# Patient Record
Sex: Female | Born: 1995 | Race: White | Hispanic: No | Marital: Single
Health system: Southern US, Academic
[De-identification: ages and names within clinical notes are randomized; demographics above are authoritative.]

## PROBLEM LIST (undated history)

## (undated) ENCOUNTER — Encounter
Attending: Student in an Organized Health Care Education/Training Program | Primary: Student in an Organized Health Care Education/Training Program

## (undated) ENCOUNTER — Telehealth

## (undated) ENCOUNTER — Encounter

## (undated) ENCOUNTER — Ambulatory Visit

## (undated) ENCOUNTER — Ambulatory Visit: Payer: PRIVATE HEALTH INSURANCE

## (undated) ENCOUNTER — Encounter: Attending: Internal Medicine | Primary: Internal Medicine

## (undated) ENCOUNTER — Ambulatory Visit
Attending: Student in an Organized Health Care Education/Training Program | Primary: Student in an Organized Health Care Education/Training Program

## (undated) ENCOUNTER — Encounter: Attending: Dermatology | Primary: Dermatology

## (undated) ENCOUNTER — Ambulatory Visit
Payer: Medicaid (Managed Care) | Attending: Student in an Organized Health Care Education/Training Program | Primary: Student in an Organized Health Care Education/Training Program

## (undated) ENCOUNTER — Ambulatory Visit
Payer: PRIVATE HEALTH INSURANCE | Attending: Student in an Organized Health Care Education/Training Program | Primary: Student in an Organized Health Care Education/Training Program

## (undated) ENCOUNTER — Encounter
Payer: Medicaid (Managed Care) | Attending: Student in an Organized Health Care Education/Training Program | Primary: Student in an Organized Health Care Education/Training Program

## (undated) ENCOUNTER — Telehealth: Attending: Dermatology | Primary: Dermatology

## (undated) ENCOUNTER — Encounter: Payer: PRIVATE HEALTH INSURANCE | Attending: Dermatology | Primary: Dermatology

## (undated) ENCOUNTER — Encounter: Attending: Obstetrics & Gynecology | Primary: Obstetrics & Gynecology

## (undated) ENCOUNTER — Ambulatory Visit: Payer: PRIVATE HEALTH INSURANCE | Attending: Obstetrics & Gynecology | Primary: Obstetrics & Gynecology

## (undated) ENCOUNTER — Ambulatory Visit: Payer: PRIVATE HEALTH INSURANCE | Attending: Dermatology | Primary: Dermatology

## (undated) DIAGNOSIS — F32A Depression, unspecified: Secondary | ICD-10-CM

## (undated) DIAGNOSIS — E282 Polycystic ovarian syndrome: Secondary | ICD-10-CM

## (undated) DIAGNOSIS — I10 Essential (primary) hypertension: Secondary | ICD-10-CM

## (undated) DIAGNOSIS — G4700 Insomnia, unspecified: Secondary | ICD-10-CM

## (undated) DIAGNOSIS — F329 Major depressive disorder, single episode, unspecified: Secondary | ICD-10-CM

## (undated) DIAGNOSIS — R933 Abnormal findings on diagnostic imaging of other parts of digestive tract: Secondary | ICD-10-CM

## (undated) DIAGNOSIS — K760 Fatty (change of) liver, not elsewhere classified: Secondary | ICD-10-CM

## (undated) HISTORY — DX: Abnormal findings on diagnostic imaging of other parts of digestive tract: R93.3

## (undated) HISTORY — PX: TONSILLECTOMY: SUR1361

## (undated) MED ORDER — MEDROXYPROGESTERONE 10 MG TABLET
Freq: Every day | ORAL | 0 days
Start: ? — End: 2020-04-05

---
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
## 2017-08-15 ENCOUNTER — Emergency Department (HOSPITAL_COMMUNITY)
Admission: EM | Admit: 2017-08-15 | Discharge: 2017-08-15 | Disposition: A | Discharge status: Home / Self Care | Payer: PRIVATE HEALTH INSURANCE | Attending: Emergency Medicine | Admitting: Emergency Medicine

## 2017-08-15 ENCOUNTER — Other Ambulatory Visit: Payer: Self-pay

## 2017-08-15 ENCOUNTER — Emergency Department (HOSPITAL_COMMUNITY): Payer: PRIVATE HEALTH INSURANCE

## 2017-08-15 ENCOUNTER — Encounter (HOSPITAL_COMMUNITY): Payer: Self-pay

## 2017-08-15 DIAGNOSIS — Y999 Unspecified external cause status: Secondary | ICD-10-CM | POA: Insufficient documentation

## 2017-08-15 DIAGNOSIS — Y929 Unspecified place or not applicable: Secondary | ICD-10-CM | POA: Insufficient documentation

## 2017-08-15 DIAGNOSIS — S0990XA Unspecified injury of head, initial encounter: Secondary | ICD-10-CM | POA: Diagnosis present

## 2017-08-15 DIAGNOSIS — W2209XA Striking against other stationary object, initial encounter: Secondary | ICD-10-CM | POA: Diagnosis not present

## 2017-08-15 DIAGNOSIS — Z79899 Other long term (current) drug therapy: Secondary | ICD-10-CM | POA: Insufficient documentation

## 2017-08-15 DIAGNOSIS — S060X1A Concussion with loss of consciousness of 30 minutes or less, initial encounter: Secondary | ICD-10-CM | POA: Diagnosis not present

## 2017-08-15 DIAGNOSIS — Y939 Activity, unspecified: Secondary | ICD-10-CM | POA: Insufficient documentation

## 2017-08-15 DIAGNOSIS — I10 Essential (primary) hypertension: Secondary | ICD-10-CM | POA: Insufficient documentation

## 2017-08-15 HISTORY — DX: Essential (primary) hypertension: I10

## 2017-08-15 LAB — I-STAT CHEM 8, ED
BUN: 10 mg/dL (ref 6–20)
CHLORIDE: 102 mmol/L (ref 98–111)
Calcium, Ion: 1.18 mmol/L (ref 1.15–1.40)
Creatinine, Ser: 0.5 mg/dL (ref 0.44–1.00)
Glucose, Bld: 89 mg/dL (ref 70–99)
HEMATOCRIT: 38 % (ref 36.0–46.0)
Hemoglobin: 12.9 g/dL (ref 12.0–15.0)
POTASSIUM: 3.9 mmol/L (ref 3.5–5.1)
SODIUM: 139 mmol/L (ref 135–145)
TCO2: 23 mmol/L (ref 22–32)

## 2017-08-15 LAB — I-STAT BETA HCG BLOOD, ED (MC, WL, AP ONLY)

## 2017-08-15 MED ORDER — METOCLOPRAMIDE HCL 10 MG PO TABS
10.0000 mg | ORAL_TABLET | Freq: Once | ORAL | Status: AC
  Administered 2017-08-15: 10 mg via ORAL
  Filled 2017-08-15: qty 1

## 2017-08-15 MED ORDER — ACETAMINOPHEN 325 MG PO TABS: 650.0000 mg | ORAL_TABLET | ORAL | Status: DC | PRN

## 2017-08-15 MED ORDER — METOCLOPRAMIDE HCL 10 MG PO TABS: 10.0000 mg | ORAL_TABLET | Freq: Four times a day (QID) | ORAL | 0 refills | Status: DC | PRN

## 2017-08-15 MED ORDER — IBUPROFEN 600 MG PO TABS: 600.0000 mg | ORAL_TABLET | Freq: Four times a day (QID) | ORAL | 0 refills | Status: DC | PRN

## 2017-08-15 MED ORDER — IBUPROFEN 200 MG PO TABS
600.0000 mg | ORAL_TABLET | Freq: Once | ORAL | Status: AC
  Administered 2017-08-15: 600 mg via ORAL
  Filled 2017-08-15: qty 3

## 2017-08-15 NOTE — ED Provider Notes (Signed)
[facility]-EMERGENCY DEPT Provider Note   CSN: [id number] Arrival date & time: 08/15/17  1231     History   Chief Complaint Chief Complaint  Patient presents with  . Facial Injury  . Loss of Consciousness    HPI Abigail Hale is a 22 y.o. female.  HPI Patient states she was at work when she stood up and struck her forehead and nose on a metal bar.  She had loss of consciousness for less than a minute.  No previous history of concussion.  States she is having headache, dizziness, photophobia and nausea currently.  Denies neck pain, focal weakness or numbness. Past Medical History:  Diagnosis Date  . Hypertension     There are no active problems to display for this patient.   Past Surgical History:  Procedure Laterality Date  . TONSILLECTOMY       OB History   None      Home Medications    Prior to Admission medications   Medication Sig Start Date End Date Taking? Authorizing Provider  metFORMIN (GLUCOPHAGE) 500 MG tablet Take 500 mg by mouth 2 (two) times daily with a meal.   Yes [provider]  QUEtiapine (SEROQUEL) 50 MG tablet Take 50 mg by mouth at bedtime.   Yes [provider]  spironolactone (ALDACTONE) 100 MG tablet Take 100 mg by mouth daily. 08/09/17  Yes [provider]  venlafaxine XR (EFFEXOR-XR) 150 MG 24 hr capsule Take 150 mg by mouth daily. 08/09/17  Yes [provider]  ibuprofen (ADVIL,MOTRIN) 600 MG tablet Take 1 tablet (600 mg total) by mouth every 6 (six) hours as needed. 08/15/17   Loren Racer, MD  metoCLOPramide (REGLAN) 10 MG tablet Take 1 tablet (10 mg total) by mouth every 6 (six) hours as needed for nausea. 08/15/17   Loren Racer, MD    Family History No family history on file.  Social History Social History   Tobacco Use  . Smoking status: Never Smoker  . Smokeless tobacco: Never Used  Substance Use Topics  . Alcohol use: Yes    Comment: socially  . Drug use:  Never     Allergies   Amoxicillin and Penicillins   Review of Systems Review of Systems  Constitutional: Negative for chills and fever.  HENT: Negative for facial swelling.   Eyes: Positive for photophobia.  Respiratory: Negative for shortness of breath.   Cardiovascular: Negative for chest pain.  Gastrointestinal: Positive for nausea. Negative for abdominal pain and vomiting.  Musculoskeletal: Negative for back pain and neck pain.  Skin: Positive for wound. Negative for rash.  Neurological: Positive for dizziness, syncope, light-headedness and headaches. Negative for weakness and numbness.  All other systems reviewed and are negative.    Physical Exam Updated Vital Signs BP 123/78 (BP Location: Right Arm)   Pulse 99   Temp 97.8 F (36.6 C) (Oral)   Resp 18   Ht 5\' 6"  (1.676 m)   Wt 117.9 kg (260 lb)   LMP 08/05/2017   SpO2 99%   BMI 41.97 kg/m   Physical Exam  Constitutional: She is oriented to person, place, and time. She appears well-developed and well-nourished. No distress.  HENT:  Head: Normocephalic and atraumatic.  Mouth/Throat: Oropharynx is clear and moist.  Patient with small abrasion to the bridge of the nose.  There is no active bleeding.  She does have some tenderness with palpation of the bridge.  Midface is stable.  No malocclusion.  No  intraoral trauma.  Eyes: Pupils are equal, round, and reactive to light. EOM are normal.  Neck: Normal range of motion. Neck supple.  No posterior midline cervical tenderness to palpation.  Cardiovascular: Normal rate and regular rhythm.  Pulmonary/Chest: Effort normal and breath sounds normal.  Abdominal: Soft. Bowel sounds are normal. There is no tenderness. There is no rebound and no guarding.  Musculoskeletal: Normal range of motion. She exhibits no edema or tenderness.  Neurological: She is alert and oriented to person, place, and time.  Patient is alert and oriented x3 with clear, goal oriented speech. Patient  has 5/5 motor in all extremities. Sensation is intact to light touch. Bilateral finger-to-nose is normal with no signs of dysmetria.  Skin: Skin is warm and dry. Capillary refill takes less than 2 seconds. No rash noted. She is not diaphoretic. No erythema.  Psychiatric: She has a normal mood and affect. Her behavior is normal.  Nursing note and vitals reviewed.    ED Treatments / Results  Labs (all labs ordered are listed, but only abnormal results are displayed) Labs Reviewed  I-STAT CHEM 8, ED  I-STAT BETA HCG BLOOD, ED ([facility], [facility], [facility] ONLY)    EKG None  Radiology Ct Head Wo Contrast  Result Date: 08/15/2017 CLINICAL DATA:  Dizziness and possible loss of consciousness post facial trauma. EXAM: CT HEAD WITHOUT CONTRAST TECHNIQUE: Contiguous axial images were obtained from the base of the skull through the vertex without intravenous contrast. COMPARISON:  None. FINDINGS: Brain: No evidence of acute infarction, hemorrhage, hydrocephalus, extra-axial collection or mass lesion/mass effect. Vascular: No hyperdense vessel or unexpected calcification. Skull: Normal. Negative for fracture or focal lesion. Sinuses/Orbits: No acute finding. Other: None. IMPRESSION: No acute intracranial abnormality. Electronically Signed   By: Ted Mcalpineobrinka  Dimitrova M.D.   On: 08/15/2017 15:10    Procedures Procedures (including critical care time)  Medications Ordered in ED Medications  ibuprofen (ADVIL,MOTRIN) tablet 600 mg (has no administration in time range)  acetaminophen (TYLENOL) tablet 650 mg (has no administration in time range)  metoCLOPramide (REGLAN) tablet 10 mg (has no administration in time range)     Initial Impression / Assessment and Plan / ED Course  I have reviewed the triage vital signs and the nursing notes.  Pertinent labs & imaging results that were available during my care of the patient were reviewed by me and considered in my medical decision making (see chart for details).      Normal neurologic exam.  Vital signs are stable.  CT head without acute findings.  Given concussion precautions and work note.  Gradually return to normal activities.  If symptoms persist follow-up with neurology as outpatient.  Return precautions given.   Final Clinical Impressions(s) / ED Diagnoses   Final diagnoses:  Concussion with loss of consciousness of 30 minutes or less, initial encounter    ED Discharge Orders        Ordered    ibuprofen (ADVIL,MOTRIN) 600 MG tablet  Every 6 hours PRN     08/15/17 1553    metoCLOPramide (REGLAN) 10 MG tablet  Every 6 hours PRN     08/15/17 1553       Loren RacerYelverton, Anouk Critzer, MD 08/15/17 1606

## 2017-08-15 NOTE — ED Triage Notes (Addendum)
Pt was standing up at work and hit the bridge of her nose on a metal handle. Pt states that she hit very hard and her glasses went flying. Pt states that she is dizzy from the incident. Pt states that she may have "blacked out"

## 2017-08-15 NOTE — ED Notes (Signed)
Ice pack given

## 2017-08-25 ENCOUNTER — Ambulatory Visit
Admission: EM | Admit: 2017-08-25 | Discharge: 2017-08-25 | Disposition: A | Discharge status: Home / Self Care | Payer: Self-pay | Attending: Family Medicine | Admitting: Family Medicine

## 2017-08-25 ENCOUNTER — Encounter: Payer: Self-pay | Admitting: Emergency Medicine

## 2017-08-25 ENCOUNTER — Other Ambulatory Visit: Payer: Self-pay

## 2017-08-25 DIAGNOSIS — R21 Rash and other nonspecific skin eruption: Secondary | ICD-10-CM

## 2017-08-25 DIAGNOSIS — W57XXXA Bitten or stung by nonvenomous insect and other nonvenomous arthropods, initial encounter: Secondary | ICD-10-CM

## 2017-08-25 MED ORDER — METHYLPREDNISOLONE 4 MG PO TBPK: ORAL_TABLET | ORAL | 0 refills | Status: DC

## 2017-08-25 NOTE — Discharge Instructions (Addendum)
Please continue with Benadryl and calamine lotion.  Take prednisone as prescribed.  Avoid hot showers.  If any worsening rash, fevers return to the clinic.

## 2017-08-25 NOTE — ED Triage Notes (Signed)
Patient c/o red itchy bumps all over her body that started 2 weeks ago.

## 2017-08-25 NOTE — ED Provider Notes (Signed)
[facility]    CSN: [id number] Arrival date & time: 08/25/17  0803     History   Chief Complaint Chief Complaint  Patient presents with  . Rash    APPOINTMENT    HPI Abigail Hale is a 22 y.o. female.   Presents to the urgent care facility for evaluation of rash along her forearms, lower legs.  Rashes been present for nearly 2 weeks.  She denies any close contacts with similar rashes.  No new sleeping arrangements.  She is been using Benadryl and calamine lotion with mild relief.  She denies any fevers, headaches, body aches, neck pain, abdominal pain, nausea or vomiting.  She describes the rash is extremely pruritic.  She recently stayed with her boyfriend who has a dog, there is concern for fleas.  No interdigital rash no rash on her abdomen perineal region chest or back. HPI  Past Medical History:  Diagnosis Date  . Hypertension     There are no active problems to display for this patient.   Past Surgical History:  Procedure Laterality Date  . TONSILLECTOMY      OB History   None      Home Medications    Prior to Admission medications   Medication Sig Start Date End Date Taking? Authorizing Provider  amLODipine (NORVASC) 2.5 MG tablet Take 2.5 mg by mouth daily.   Yes [provider]  metFORMIN (GLUCOPHAGE) 500 MG tablet Take 500 mg by mouth 2 (two) times daily with a meal.   Yes [provider]  metoCLOPramide (REGLAN) 10 MG tablet Take 1 tablet (10 mg total) by mouth every 6 (six) hours as needed for nausea. 08/15/17  Yes Loren Racer, MD  QUEtiapine (SEROQUEL) 50 MG tablet Take 50 mg by mouth at bedtime.   Yes [provider]  spironolactone (ALDACTONE) 100 MG tablet Take 100 mg by mouth daily. 08/09/17  Yes [provider]  venlafaxine XR (EFFEXOR-XR) 150 MG 24 hr capsule Take 150 mg by mouth daily. 08/09/17  Yes [provider]  ibuprofen (ADVIL,MOTRIN) 600 MG tablet Take 1 tablet (600 mg total) by  mouth every 6 (six) hours as needed. 08/15/17   Loren Racer, MD  methylPREDNISolone (MEDROL DOSEPAK) 4 MG TBPK tablet Take Medrol Dosepak as directed by package 08/25/17   Evon Slack, PA-C    Family History History reviewed. No pertinent family history.  Social History Social History   Tobacco Use  . Smoking status: Never Smoker  . Smokeless tobacco: Never Used  Substance Use Topics  . Alcohol use: Yes    Comment: socially  . Drug use: Never     Allergies   Amoxicillin and Penicillins   Review of Systems Review of Systems  Constitutional: Negative for fever.  Respiratory: Negative for shortness of breath.   Cardiovascular: Negative for chest pain.  Gastrointestinal: Negative for nausea and vomiting.  Musculoskeletal: Negative for arthralgias, back pain and myalgias.  Skin: Positive for rash. Negative for wound.  Neurological: Negative for headaches.     Physical Exam Triage Vital Signs ED Triage Vitals  Enc Vitals Group     BP 08/25/17 0819 (!) 146/80     Pulse Rate 08/25/17 0819 100     Resp 08/25/17 0819 16     Temp 08/25/17 0819 98.2 F (36.8 C)     Temp Source 08/25/17 0819 Oral     SpO2 08/25/17 0819 100 %     Weight 08/25/17 0816 265 lb (120.2  kg)     Height 08/25/17 0816 5\' 6"  (1.676 m)     Head Circumference --      Peak Flow --      Pain Score 08/25/17 0815 0     Pain Loc --      Pain Edu? --      Excl. in GC? --    No data found.  Updated Vital Signs BP (!) 146/80 (BP Location: Left Arm)   Pulse 100   Temp 98.2 F (36.8 C) (Oral)   Resp 16   Ht 5\' 6"  (1.676 m)   Wt 265 lb (120.2 kg)   LMP 08/05/2017   SpO2 100%   BMI 42.77 kg/m   Visual Acuity Right Eye Distance:   Left Eye Distance:   Bilateral Distance:    Right Eye Near:   Left Eye Near:    Bilateral Near:     Physical Exam  Constitutional: She is oriented to person, place, and time. She appears well-developed and well-nourished.  HENT:  Head: Normocephalic and  atraumatic.  Eyes: Conjunctivae are normal.  Neck: Normal range of motion.  Cardiovascular: Normal rate.  Pulmonary/Chest: Effort normal. No respiratory distress.  Musculoskeletal: Normal range of motion.  Neurological: She is alert and oriented to person, place, and time.  Skin: Skin is warm. Rash noted.  Maculopapular rash along the lower extremities of the left and right tib-fib region and forearms bilaterally.  No signs of cellulitis.  No interdigital lesions.  No rash throughout the abdomen or back or upper legs.  Psychiatric: She has a normal mood and affect. Her behavior is normal. Thought content normal.     UC Treatments / Results  Labs (all labs ordered are listed, but only abnormal results are displayed) Labs Reviewed - No data to display  EKG None  Radiology No results found.  Procedures Procedures (including critical care time)  Medications Ordered in UC Medications - No data to display  Initial Impression / Assessment and Plan / UC Course  I have reviewed the triage vital signs and the nursing notes.  Pertinent labs & imaging results that were available during my care of the patient were reviewed by me and considered in my medical decision making (see chart for details).     22 year old female with rash for 2 weeks.  Rash does not seem to be consistent with scabies but it could possibly be bedbugs or fleas.  Patient will continue with calamine lotion, Benadryl she is given a Medrol Dosepak to help with itching.  She is educated on signs and symptoms return to clinic for. Final Clinical Impressions(s) / UC Diagnoses   Final diagnoses:  Rash and nonspecific skin eruption  Bug bite, initial encounter     Discharge Instructions     Please continue with Benadryl and calamine lotion.  Take prednisone as prescribed.  Avoid hot showers.  If any worsening rash, fevers return to the clinic.   ED Prescriptions    Medication Sig Dispense Auth. Provider    methylPREDNISolone (MEDROL DOSEPAK) 4 MG TBPK tablet Take Medrol Dosepak as directed by package 21 tablet Willeen CassGaines, Thomas C, PA-C       Gaines, Thomas C, [place]PA-C 08/25/17 [phone]

## 2017-11-25 ENCOUNTER — Ambulatory Visit
Admission: EM | Admit: 2017-11-25 | Discharge: 2017-11-25 | Disposition: A | Discharge status: Home / Self Care | Payer: Self-pay | Attending: Internal Medicine | Admitting: Internal Medicine

## 2017-11-25 ENCOUNTER — Other Ambulatory Visit: Payer: Self-pay

## 2017-11-25 ENCOUNTER — Encounter: Payer: Self-pay | Admitting: Emergency Medicine

## 2017-11-25 DIAGNOSIS — X58XXXA Exposure to other specified factors, initial encounter: Secondary | ICD-10-CM

## 2017-11-25 DIAGNOSIS — S93401A Sprain of unspecified ligament of right ankle, initial encounter: Secondary | ICD-10-CM

## 2017-11-25 HISTORY — DX: Polycystic ovarian syndrome: E28.2

## 2017-11-25 NOTE — ED Provider Notes (Signed)
[facility]    CSN: [id number] Arrival date & time: 11/25/17  1400     History   Chief Complaint Chief Complaint  Patient presents with  . Ankle Injury    HPI Abigail Hale is a 22 y.o. female.   She  Was walking on concrete at work  Yesterday and her R ankle gave out and inverted inwards. She did not  Feel or hear a pop. Has sprained this ankle in the past. Denies bruising. The area of pain feels a little numb. She went home right away and iced it and took Aleve. This am when she tried to walk feels worse. Pain is provoked with weight barring, though  She is able to walk on it, and also provoked with moving ankle up and down. Better with rest, elevation, Aleve. Pain does not radiate. Does not have pain in any other joint.     Past Medical History:  Diagnosis Date  . Hypertension   . PCOS (polycystic ovarian syndrome)     There are no active problems to display for this patient.   Past Surgical History:  Procedure Laterality Date  . TONSILLECTOMY      OB History   None      Home Medications    Prior to Admission medications   Medication Sig Start Date End Date Taking? Authorizing Provider  amLODipine (NORVASC) 2.5 MG tablet Take 2.5 mg by mouth daily.   Yes [provider]  metFORMIN (GLUCOPHAGE) 500 MG tablet Take 500 mg by mouth 2 (two) times daily with a meal.   Yes [provider]  spironolactone (ALDACTONE) 100 MG tablet Take 100 mg by mouth daily. 08/09/17  Yes [provider]  venlafaxine XR (EFFEXOR-XR) 150 MG 24 hr capsule Take 150 mg by mouth daily. 08/09/17  Yes [provider]    Family History Family History  Problem Relation Age of Onset  . Healthy Mother   . Diabetes Father     Social History Social History   Tobacco Use  . Smoking status: Never Smoker  . Smokeless tobacco: Never Used  Substance Use Topics  . Alcohol use: Yes    Comment: socially  . Drug use: Never     Allergies     Amoxicillin and Penicillins   Review of Systems Review of Systems  Musculoskeletal: Positive for arthralgias, gait problem and joint swelling.       See HPI  Skin: Negative for color change, pallor, rash and wound.  Neurological: Positive for numbness. Negative for weakness.       See HPI      Physical Exam Triage Vital Signs ED Triage Vitals  Enc Vitals Group     BP 11/25/17 1416 132/90     Pulse Rate 11/25/17 1416 100     Resp 11/25/17 1416 18     Temp 11/25/17 1416 98.3 F (36.8 C)     Temp Source 11/25/17 1416 Oral     SpO2 11/25/17 1416 100 %     Weight 11/25/17 1417 260 lb (117.9 kg)     Height 11/25/17 1417 5\' 6"  (1.676 m)     Head Circumference --      Peak Flow --      Pain Score 11/25/17 1417 9     Pain Loc --      Pain Edu? --      Excl. in GC? --    No data found.  Updated Vital Signs BP 132/90 (BP  Location: Right Arm)   Pulse 100   Temp 98.3 F (36.8 C) (Oral)   Resp 18   Ht 5\' 6"  (1.676 m)   Wt 260 lb (117.9 kg)   LMP 11/09/2017   SpO2 100%   BMI 41.97 kg/m   Visual Acuity Right Eye Distance:   Left Eye Distance:   Bilateral Distance:    Right Eye Near:   Left Eye Near:    Bilateral Near:     Physical Exam  Constitutional: She is oriented to person, place, and time. She appears well-developed and well-nourished. No distress.  HENT:  Head: Normocephalic and atraumatic.  Right Ear: External ear normal.  Left Ear: External ear normal.  Nose: Nose normal.  Eyes: Conjunctivae are normal. Right eye exhibits no discharge. Left eye exhibits no discharge. No scleral icterus.  Neck: Neck supple.  Cardiovascular: Intact distal pulses.  Pulmonary/Chest: Effort normal.  Musculoskeletal: She exhibits tenderness. She exhibits no edema or deformity.  Has mild tenderness on mid soft tissue of foot and moderate on lateral L malleolus soft tissue. Thompson test neg. Has mild swelling of lateral ankle. No ecchymosis or wounds noted. Strength is 4/5  with dorsiflexion and dorsiextension. Internal inversion and eversion 3/5 due to pain  Neurological: She is alert and oriented to person, place, and time. No sensory deficit.  Skin: Skin is warm and dry. Capillary refill takes less than 2 seconds. No rash noted. She is not diaphoretic. No erythema. No pallor.  Psychiatric: She has a normal mood and affect. Her behavior is normal. Judgment and thought content normal.  Nursing note and vitals reviewed.  UC Treatments / Results  Labs (all labs ordered are listed, but only abnormal results are displayed) Labs Reviewed - No data to display  EKG None  Radiology No results found.   Medications Ordered in UC No current facility-administered medications on file prior to encounter.    Current Outpatient Medications on File Prior to Encounter  Medication Sig Dispense Refill  . amLODipine (NORVASC) 2.5 MG tablet Take 2.5 mg by mouth daily.    . metFORMIN (GLUCOPHAGE) 500 MG tablet Take 500 mg by mouth 2 (two) times daily with a meal.    . spironolactone (ALDACTONE) 100 MG tablet Take 100 mg by mouth daily.  6  . venlafaxine XR (EFFEXOR-XR) 150 MG 24 hr capsule Take 150 mg by mouth daily.  4    Initial Impression / Assessment and Plan / UC Course  I have reviewed the triage vital signs and the nursing notes. I explained to pt I do not suspect a fracture. I taught her to do ankle exercises as she heels and needs to get established with Buzzy Han FNP and FU this week.        Final Clinical Impressions(s) / UC Diagnoses   Final diagnoses:  Sprain of right ankle, unspecified ligament, initial encounter     Discharge Instructions     Ice area of pain for 15-20 minutes at a time 2-4 times a day for 48h. After that alternate ice and heat. Do stretches after heat.  Take Ibuprofen 400- 800 mg every 8h, and if you need more pain relief, you may add Tylenol between doses.    Do ankle exercises I taught you 2-4 times a day Follow up with  your family Dr in one week.     ED Prescriptions    None     Controlled Substance Prescriptions [place] Controlled Substance Registry consulted? no   Rodriguez-Southworth,  Nettie Elm, PA-C 11/27/17 [phone]

## 2017-11-25 NOTE — Discharge Instructions (Addendum)
Ice area of pain for 15-20 minutes at a time 2-4 times a day for 48h. After that alternate ice and heat. Do stretches after heat.  Take Ibuprofen 400- 800 mg every 8h, and if you need more pain relief, you may add Tylenol between doses.    Do ankle exercises I taught you 2-4 times a day Follow up with your family Dr in one week.

## 2017-11-25 NOTE — ED Triage Notes (Signed)
Patient c/o falling yesterday, right ankle twisted and she fell completely to the ground. Patient states her right ankle is swollen and she is having ankle pain.

## 2017-12-23 ENCOUNTER — Ambulatory Visit
Admission: EM | Admit: 2017-12-23 | Discharge: 2017-12-23 | Disposition: A | Discharge status: Home / Self Care | Payer: Self-pay | Attending: Emergency Medicine | Admitting: Emergency Medicine

## 2017-12-23 ENCOUNTER — Other Ambulatory Visit: Payer: Self-pay

## 2017-12-23 ENCOUNTER — Encounter: Payer: Self-pay | Admitting: Emergency Medicine

## 2017-12-23 DIAGNOSIS — J069 Acute upper respiratory infection, unspecified: Secondary | ICD-10-CM

## 2017-12-23 DIAGNOSIS — J0100 Acute maxillary sinusitis, unspecified: Secondary | ICD-10-CM

## 2017-12-23 HISTORY — DX: Insomnia, unspecified: G47.00

## 2017-12-23 HISTORY — DX: Major depressive disorder, single episode, unspecified: F32.9

## 2017-12-23 HISTORY — DX: Depression, unspecified: F32.A

## 2017-12-23 LAB — RAPID STREP SCREEN (MED CTR MEBANE ONLY): Streptococcus, Group A Screen (Direct): NEGATIVE

## 2017-12-23 MED ORDER — IBUPROFEN 600 MG PO TABS: 600.0000 mg | ORAL_TABLET | Freq: Four times a day (QID) | ORAL | 0 refills | Status: DC | PRN

## 2017-12-23 MED ORDER — FLUTICASONE PROPIONATE 50 MCG/ACT NA SUSP: 2.0000 | Freq: Every day | NASAL | 0 refills | Status: DC

## 2017-12-23 MED ORDER — DOXYCYCLINE HYCLATE 100 MG PO CAPS: 100.0000 mg | ORAL_CAPSULE | Freq: Two times a day (BID) | ORAL | 0 refills | Status: AC

## 2017-12-23 NOTE — Discharge Instructions (Addendum)
saline nasal irrigation with a Lloyd HugerNeil med rinse with distilled water as often as you want, continue Mucinex, start Flonase, ibuprofen 600 mg to take with 1 g of Tylenol together 3 or 4 times a day as needed for pain, and a wait-and-see prescription of doxycycline.   discontinue all other cold medications. Here is a list of primary care providers who are taking new patients:  Dr. Elizabeth Sauereanna Jones, Dr. Schuyler AmorWilliam Plonk [address] [place] [address] [phone]  [facility] [place] [address]  [place] [address]  [phone]  [facility] [address]  [place], [place] [address] [phone]  [facility] [place] [address]  [phone] [place], [place] [address]  Here are clinics/ other resources who will see you if you do not have insurance. Some have certain criteria that you must meet. Call them and find out what they are:  [facility]: [address] [place] [place], [place] [address] Phone: [phone] Hours: First and Third Saturdays of each Month, 9 a.m. - 1 p.m.  [facility]: [address], [place], [place] [address] Phone: [phone] Hours: Tuesday, 4 p.m. - 8 p.m. Thursday, 1 p.m. - 8 p.m. Wednesday, 9 a.m. - [facility]  [facility] [address], [place], [place] [address] Phone: [phone] Pharmacy Phone Number: [phone] Dental Phone Number: [phone] [facility] Insurance Help: [phone]  Dental Hours: Monday - Thursday, 8 a.m. - 6 p.m.  [facility] [address] [place], [place] [address] Phone: [phone] Pharmacy Phone Number: [phone] [facility] Insurance Help: [phone]  [facility] [address] [place], [place] [address] Phone: [phone] Pharmacy Phone Number: [phone] [facility] Insurance Help: [phone]  [facility] [address] [place], [place] [address] Phone: [phone] [facility] Insurance Help:  [phone]   [facility] [address] [place], [place] [address] Phone: [phone]  Go to www.goodrx.com to look up your medications. This will give you a list of where you can find your prescriptions at the most affordable prices. Or ask the pharmacist what the cash price is, or if they have any other discount programs available to help make your medication more affordable. This can be less expensive than what you would pay with insurance.

## 2017-12-23 NOTE — ED Triage Notes (Signed)
Patient in today c/o sore throat x 1 week, nasal congestion and ear pain x 2 days. Patient denies fever. Patient has tried OTC Mucinex without relief.

## 2017-12-23 NOTE — ED Provider Notes (Signed)
HPI  SUBJECTIVE:  Abigail Hale is a 22 y.o. female who presents with sore throat for a week.  She denies the sensation of her throat swelling shut, headaches, body aches, abdominal pain, rash.  She reports yellow nasal congestion starting 2 days ago and a cough starting today.  She reports facial swelling at night around her nose.  She reports sinus pain and pressure.  Also bilateral ear pain starting 2 days ago and decreased hearing in her left ear.  No otorrhea.  No fevers, upper dental pain.  No rhinorrhea, postnasal drip.  No allergy symptoms.  No antibiotics in the past month.  No antipyretic in the past 4 to 6 hours.  She  tried Mucinex, DayQuil, and unknown allergy nose spray and 800 mg ibuprofen.  The ibuprofen helps.  No aggravating factors.  She is not using any nasal steroids.  She has a past medical history of PCOS, hypertension, status post tonsillectomy.  No history of diabetes, frequent sinusitis, asthma, smoking.  LMP: 2 weeks ago.  States that we do not need to check for pregnancy.  PMD: None.   Past Medical History:  Diagnosis Date  . Depression   . Hypertension   . Insomnia   . PCOS (polycystic ovarian syndrome)     Past Surgical History:  Procedure Laterality Date  . TONSILLECTOMY      Family History  Problem Relation Age of Onset  . Healthy Mother   . Diabetes Father     Social History   Tobacco Use  . Smoking status: Never Smoker  . Smokeless tobacco: Never Used  Substance Use Topics  . Alcohol use: Yes    Comment: socially  . Drug use: Never    No current facility-administered medications for this encounter.   Current Outpatient Medications:  .  amLODipine (NORVASC) 2.5 MG tablet, Take 2.5 mg by mouth daily., Disp: , Rfl:  .  metFORMIN (GLUCOPHAGE) 500 MG tablet, Take 500 mg by mouth 2 (two) times daily with a meal., Disp: , Rfl:  .  QUEtiapine (SEROQUEL) 50 MG tablet, Take by mouth., Disp: , Rfl:  .  spironolactone (ALDACTONE) 100 MG tablet, Take  100 mg by mouth daily., Disp: , Rfl: 6 .  venlafaxine XR (EFFEXOR-XR) 150 MG 24 hr capsule, Take 150 mg by mouth daily., Disp: , Rfl: 4 .  doxycycline (VIBRAMYCIN) 100 MG capsule, Take 1 capsule (100 mg total) by mouth 2 (two) times daily for 7 days., Disp: 14 capsule, Rfl: 0 .  fluticasone (FLONASE) 50 MCG/ACT nasal spray, Place 2 sprays into both nostrils daily., Disp: 16 g, Rfl: 0 .  ibuprofen (ADVIL,MOTRIN) 600 MG tablet, Take 1 tablet (600 mg total) by mouth every 6 (six) hours as needed., Disp: 30 tablet, Rfl: 0  Allergies  Allergen Reactions  . Amoxicillin Hives  . Penicillins Hives    Has patient had a PCN reaction causing immediate rash, facial/tongue/throat swelling, SOB or lightheadedness with hypotension: No Has patient had a PCN reaction causing severe rash involving mucus membranes or skin necrosis: No Has patient had a PCN reaction that required hospitalization: No Has patient had a PCN reaction occurring within the last 10 years: No If all of the above answers are "NO", then may proceed with Cephalosporin use.      ROS  As noted in HPI.   Physical Exam  BP 127/87 (BP Location: Left Arm)   Pulse 100   Temp 98.6 F (37 C) (Oral)   Resp 16  Ht 5\' 6"  (1.676 m)   Wt 120.2 kg   LMP 12/09/2017 (Approximate)   SpO2 100%   BMI 42.77 kg/m    Constitutional: Well developed, well nourished, no acute distress Eyes:  EOMI, conjunctiva normal bilaterally HENT: Normocephalic, atraumatic,mucus membranes moist.  TMs normal bilaterally.  No effusion.  Swollen, red turbinates.  Mucoid nasal congestion.  Positive maxillary sinus tenderness.  Oropharynx slightly red.  Tonsils surgically absent.  No cobblestoning, postnasal drip. Neck: No anterior, posterior cervical lymphadenopathy Respiratory: Normal inspiratory effort, lungs clear bilaterally, good air movement Cardiovascular: Normal rate rhythm, no murmurs rubs or gallops  GI: nondistended skin: No rash, skin  intact Musculoskeletal: no deformities Neurologic: Alert & oriented x 3, no focal neuro deficits Psychiatric: Speech and behavior appropriate   ED Course   Medications - No data to display  Orders Placed This Encounter  Procedures  . Rapid Strep Screen ([facility] ONLY)    Standing Status:   Standing    Number of Occurrences:   1  . Culture, group A strep    Standing Status:   Standing    Number of Occurrences:   1    Results for orders placed or performed during the hospital encounter of 12/23/17 (from the past 24 hour(s))  Rapid Strep Screen ([facility] ONLY)     Status: None   Collection Time: 12/23/17  2:23 PM  Result Value Ref Range   Streptococcus, Group A Screen (Direct) NEGATIVE NEGATIVE   No results found.  ED Clinical Impression  Upper respiratory tract infection, unspecified type  Acute non-recurrent maxillary sinusitis   ED Assessment/Plan  Strep negative.  Suspect viral URI with a secondary sinusitis.  Her ear pain is most likely from eustachian tube dysfunction.  No evidence of OM.  Sending home with saline nasal irrigation with a Lloyd Huger med rinse with distilled water, continue Mucinex, start Flonase, ibuprofen 600 mg to take with 1 g of Tylenol together 3 or 4 times a day as needed for pain, and a wait-and-see prescription of doxycycline.  We will have her discontinue all other cold medications.  We will also provide a primary care referral list for routine care.  Discussed labs,  MDM, treatment plan, and plan for follow-up with patient.  Meds ordered this encounter  Medications  . doxycycline (VIBRAMYCIN) 100 MG capsule    Sig: Take 1 capsule (100 mg total) by mouth 2 (two) times daily for 7 days.    Dispense:  14 capsule    Refill:  0  . fluticasone (FLONASE) 50 MCG/ACT nasal spray    Sig: Place 2 sprays into both nostrils daily.    Dispense:  16 g    Refill:  0  . ibuprofen (ADVIL,MOTRIN) 600 MG tablet    Sig: Take 1 tablet (600 mg total) by  mouth every 6 (six) hours as needed.    Dispense:  30 tablet    Refill:  0    *This clinic note was created using Scientist, clinical (histocompatibility and immunogenetics). Therefore, there may be occasional mistakes despite careful proofreading.   ?    Domenick Gong, MD 12/23/17 2139

## 2017-12-26 LAB — CULTURE, GROUP A STREP (THRC)

## 2018-03-06 ENCOUNTER — Encounter
Admit: 2018-03-06 | Discharge: 2018-03-07 | Disposition: A | Discharge status: Home / Self Care | Payer: BLUE CROSS/BLUE SHIELD

## 2018-03-06 DIAGNOSIS — F329 Major depressive disorder, single episode, unspecified: Secondary | ICD-10-CM | POA: Insufficient documentation

## 2018-03-06 DIAGNOSIS — R509 Fever, unspecified: Secondary | ICD-10-CM | POA: Insufficient documentation

## 2018-03-06 DIAGNOSIS — I10 Essential (primary) hypertension: Secondary | ICD-10-CM | POA: Insufficient documentation

## 2018-03-06 DIAGNOSIS — R109 Unspecified abdominal pain: Secondary | ICD-10-CM | POA: Insufficient documentation

## 2018-03-06 DIAGNOSIS — R1030 Lower abdominal pain, unspecified: Principal | ICD-10-CM

## 2018-04-21 ENCOUNTER — Other Ambulatory Visit: Payer: Self-pay

## 2018-04-21 ENCOUNTER — Emergency Department (HOSPITAL_COMMUNITY)
Admission: EM | Admit: 2018-04-21 | Discharge: 2018-04-21 | Disposition: A | Discharge status: Home / Self Care | Payer: PRIVATE HEALTH INSURANCE | Attending: Emergency Medicine | Admitting: Emergency Medicine

## 2018-04-21 ENCOUNTER — Emergency Department (HOSPITAL_COMMUNITY): Payer: PRIVATE HEALTH INSURANCE

## 2018-04-21 DIAGNOSIS — Y9389 Activity, other specified: Secondary | ICD-10-CM | POA: Diagnosis not present

## 2018-04-21 DIAGNOSIS — S0990XA Unspecified injury of head, initial encounter: Secondary | ICD-10-CM

## 2018-04-21 DIAGNOSIS — Z79899 Other long term (current) drug therapy: Secondary | ICD-10-CM | POA: Insufficient documentation

## 2018-04-21 DIAGNOSIS — Y990 Civilian activity done for income or pay: Secondary | ICD-10-CM | POA: Insufficient documentation

## 2018-04-21 DIAGNOSIS — Y92512 Supermarket, store or market as the place of occurrence of the external cause: Secondary | ICD-10-CM | POA: Diagnosis not present

## 2018-04-21 DIAGNOSIS — Z7984 Long term (current) use of oral hypoglycemic drugs: Secondary | ICD-10-CM | POA: Insufficient documentation

## 2018-04-21 DIAGNOSIS — I10 Essential (primary) hypertension: Secondary | ICD-10-CM | POA: Diagnosis not present

## 2018-04-21 DIAGNOSIS — W208XXA Other cause of strike by thrown, projected or falling object, initial encounter: Secondary | ICD-10-CM | POA: Insufficient documentation

## 2018-04-21 DIAGNOSIS — S1093XA Contusion of unspecified part of neck, initial encounter: Secondary | ICD-10-CM | POA: Diagnosis not present

## 2018-04-21 MED ORDER — NAPROXEN 500 MG PO TABS: 500.0000 mg | ORAL_TABLET | Freq: Two times a day (BID) | ORAL | 0 refills | Status: DC

## 2018-04-21 NOTE — ED Notes (Signed)
Pt reports she was at work when a heavy box fell on her head/neck. Denies LOC. Does report some dizziness. Ambulatory.

## 2018-04-21 NOTE — ED Provider Notes (Signed)
[facility] EMERGENCY DEPARTMENT Provider Note   CSN: [id number] Arrival date & time: 04/21/18  2029    History   Chief Complaint Chief Complaint  Patient presents with   Head Injury    HPI Abigail Hale is a 23 y.o. female.     HPI  The patient is a 23 year old female, she has had an injury to the back of her head and her neck which occurred this evening while she was at work at [employer].  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  She states she had no loss of consciousness but ever since that happened she has had some pain in the back of her neck as well as a feeling of slight weakness in her left arm.  She denies loss of consciousness blurred vision or any other changes and has been ambulatory without difficulty.  Symptoms are persistent, mild to moderate, worse with palpation of the back of the head and neck  Past Medical History:  Diagnosis Date   Depression    Hypertension    Insomnia    PCOS (polycystic ovarian syndrome)     There are no active problems to display for this patient.   Past Surgical History:  Procedure Laterality Date   TONSILLECTOMY       OB History   No obstetric history on file.      Home Medications    Prior to Admission medications   Medication Sig Start Date End Date Taking? Authorizing Provider  amLODipine (NORVASC) 2.5 MG tablet Take 2.5 mg by mouth daily.    [provider]  fluticasone (FLONASE) 50 MCG/ACT nasal spray Place 2 sprays into both nostrils daily. 12/23/17   Domenick Gong, MD  ibuprofen (ADVIL,MOTRIN) 600 MG tablet Take 1 tablet (600 mg total) by mouth every 6 (six) hours as needed. 12/23/17   Domenick Gong, MD  metFORMIN (GLUCOPHAGE) 500 MG tablet Take 500 mg by mouth 2 (two) times daily with a meal.    [provider]  naproxen (NAPROSYN) 500 MG tablet Take 1 tablet (500 mg total) by mouth 2 (two) times  daily with a meal. 04/21/18   Eber Hong, MD  QUEtiapine (SEROQUEL) 50 MG tablet Take by mouth.    [provider]  spironolactone (ALDACTONE) 100 MG tablet Take 100 mg by mouth daily. 08/09/17   [provider]  venlafaxine XR (EFFEXOR-XR) 150 MG 24 hr capsule Take 150 mg by mouth daily. 08/09/17   [provider]    Family History Family History  Problem Relation Age of Onset   Healthy Mother    Diabetes Father     Social History Social History   Tobacco Use   Smoking status: Never Smoker   Smokeless tobacco: Never Used  Substance Use Topics   Alcohol use: Yes    Comment: socially   Drug use: Never     Allergies   Amoxicillin and Penicillins   Review of Systems Review of Systems  All other systems reviewed and are negative.    Physical Exam Updated Vital Signs BP (!) 144/70 (BP Location: Right Arm)    Pulse 99    Temp 97.8 F (36.6 C) (Oral)    Resp 18    Ht 1.676 m ( )    Wt 117.9 kg    SpO2 98%    BMI 41.97 kg/m   Physical Exam Vitals signs and nursing  note reviewed.  Constitutional:      General: She is not in acute distress.    Appearance: She is well-developed.  HENT:     Head: Normocephalic and atraumatic.     Comments: There is no signs of bruising hematoma lacerations or other injury to the back of the head though she is tender at the base of the skull and down onto the upper cervical spine    Mouth/Throat:     Pharynx: No oropharyngeal exudate.  Eyes:     General: No scleral icterus.       Right eye: No discharge.        Left eye: No discharge.     Conjunctiva/sclera: Conjunctivae normal.     Pupils: Pupils are equal, round, and reactive to light.  Neck:     Musculoskeletal: Muscular tenderness present.     Thyroid: No thyromegaly.     Vascular: No JVD.  Cardiovascular:     Rate and Rhythm: Normal rate and regular rhythm.     Heart sounds: Normal heart sounds. No murmur. No friction rub. No gallop.     Pulmonary:     Effort: Pulmonary effort is normal. No respiratory distress.     Breath sounds: Normal breath sounds. No wheezing or rales.  Abdominal:     General: Bowel sounds are normal. There is no distension.     Palpations: Abdomen is soft. There is no mass.     Tenderness: There is no abdominal tenderness.  Musculoskeletal: Normal range of motion.        General: Tenderness present.     Comments: Mild tenderness over the cervical spine, no other spinal tenderness this is located in the upper cervical spine  Lymphadenopathy:     Cervical: No cervical adenopathy.  Skin:    General: Skin is warm and dry.     Findings: No erythema or rash.  Neurological:     Mental Status: She is alert.     Coordination: Coordination normal.     Comments: The patient is able to move all 4 extremities, gait is normal, speech is normal, there is no pronator drift, strength is normal in the bilateral upper extremities at the grips.  She can straight leg raise bilaterally without difficulty.  Sensation is normal diffusely.  Cranial nerves III through XII are normal.  Psychiatric:        Behavior: Behavior normal.      ED Treatments / Results  Labs (all labs ordered are listed, but only abnormal results are displayed) Labs Reviewed - No data to display  EKG None  Radiology Ct Cervical Spine Wo Contrast  Result Date: 04/21/2018 CLINICAL DATA:  Trauma, injury EXAM: CT CERVICAL SPINE WITHOUT CONTRAST TECHNIQUE: Multidetector CT imaging of the cervical spine was performed without intravenous contrast. Multiplanar CT image reconstructions were also generated. COMPARISON:  None. FINDINGS: Alignment: Reversal of cervical lordosis.  No subluxation Skull base and vertebrae: No acute fracture. No primary bone lesion or focal pathologic process. Soft tissues and spinal canal: No prevertebral fluid or swelling. No visible canal hematoma. Disc levels:  Within normal limits Upper chest: Negative Other: Negative  IMPRESSION: Reversal of cervical lordosis without acute fracture identified. Electronically Signed   By: Jasmine Pang M.D.   On: 04/21/2018 22:51    Procedures Procedures (including critical care time)  Medications Ordered in ED Medications - No data to display   Initial Impression / Assessment and Plan / ED Course  I have reviewed the triage  vital signs and the nursing notes.  Pertinent labs & imaging results that were available during my care of the patient were reviewed by me and considered in my medical decision making (see chart for details).        There is actually no tenderness over the actual skull, this seems to be more the upper spinal area but there is also some paraspinal tenderness.  She has no focal neurologic deficits on exam though she states she feels a little weak in the left arm.  I will obtain a CT scan of the cervical spine to make sure there is no other injuries.  She had no loss of consciousness and there is no need for brain imaging.  CT scan negative, I personally looked at the x-rays and find no signs of broken bones.  The patient is neurologically intact and stable for discharge on an anti-inflammatory.  Final Clinical Impressions(s) / ED Diagnoses   Final diagnoses:  Contusion of neck, initial encounter  Minor head injury, initial encounter    ED Discharge Orders         Ordered    naproxen (NAPROSYN) 500 MG tablet  2 times daily with meals     04/21/18 2257           Eber Hong, MD 04/21/18 2258

## 2018-04-21 NOTE — Discharge Instructions (Signed)
Your x-ray shows no signs of broken bones or spinal injury.  Take Naprosyn twice daily as needed for pain, seek medical exam for severe or worsening symptoms.

## 2018-10-06 ENCOUNTER — Ambulatory Visit
Admission: EM | Admit: 2018-10-06 | Discharge: 2018-10-06 | Disposition: A | Discharge status: Home / Self Care | Payer: Self-pay | Attending: Family Medicine | Admitting: Family Medicine

## 2018-10-06 ENCOUNTER — Other Ambulatory Visit: Payer: Self-pay

## 2018-10-06 ENCOUNTER — Ambulatory Visit (INDEPENDENT_AMBULATORY_CARE_PROVIDER_SITE_OTHER): Payer: Self-pay

## 2018-10-06 DIAGNOSIS — M79672 Pain in left foot: Secondary | ICD-10-CM

## 2018-10-06 DIAGNOSIS — M722 Plantar fascial fibromatosis: Secondary | ICD-10-CM

## 2018-10-06 MED ORDER — MELOXICAM 15 MG PO TABS: 15.0000 mg | ORAL_TABLET | Freq: Every day | ORAL | 0 refills | Status: DC | PRN

## 2018-10-06 NOTE — ED Triage Notes (Signed)
Pt c/o left foot pain for the past year. States her work is asking for a note but the pt states she has to sit down to do her job and it is normally a standing position.

## 2018-10-06 NOTE — ED Provider Notes (Signed)
[facility] ____________________________________________  Time seen: Approximately 3:49 PM  I have reviewed the triage vital signs and the nursing notes.   HISTORY  Chief Complaint Foot Pain   HPI Abigail Hale is a 23 y.o. female presenting for evaluation of left heel pain present for at least 1 year.  Patient states in the last few weeks pain is been increasing.  Patient reports she feels that her job that she is currently working aggravates this more as it is a standing still position.  States pain sometimes radiates down her foot.  States pain starts in the heel.  States pain is worse first thing in the morning as well as at the end of a long day.  No swelling or paresthesias.  No injury or fall.  Continues to remain ambulatory.  No decreased range of motion.  No over-the-counter medication taken on a regular basis.  Reports otherwise doing well.  No recent cough or sickness.  Patient's last menstrual period was 09/10/2018 (approximate).  Denies pregnancy.   Past Medical History:  Diagnosis Date  . Depression   . Hypertension   . Insomnia   . PCOS (polycystic ovarian syndrome)     There are no active problems to display for this patient.   Past Surgical History:  Procedure Laterality Date  . TONSILLECTOMY       No current facility-administered medications for this encounter.   Current Outpatient Medications:  .  amLODipine (NORVASC) 2.5 MG tablet, Take 2.5 mg by mouth daily., Disp: , Rfl:  .  fluticasone (FLONASE) 50 MCG/ACT nasal spray, Place 2 sprays into both nostrils daily., Disp: 16 g, Rfl: 0 .  ibuprofen (ADVIL,MOTRIN) 600 MG tablet, Take 1 tablet (600 mg total) by mouth every 6 (six) hours as needed., Disp: 30 tablet, Rfl: 0 .  meloxicam (MOBIC) 15 MG tablet, Take 1 tablet (15 mg total) by mouth daily as needed., Disp: 10 tablet, Rfl: 0 .  metFORMIN (GLUCOPHAGE) 500 MG tablet, Take 500 mg by mouth 2 (two) times daily with a meal., Disp: , Rfl:  .   naproxen (NAPROSYN) 500 MG tablet, Take 1 tablet (500 mg total) by mouth 2 (two) times daily with a meal., Disp: 30 tablet, Rfl: 0 .  QUEtiapine (SEROQUEL) 50 MG tablet, Take by mouth., Disp: , Rfl:  .  spironolactone (ALDACTONE) 100 MG tablet, Take 100 mg by mouth daily., Disp: , Rfl: 6 .  venlafaxine XR (EFFEXOR-XR) 150 MG 24 hr capsule, Take 150 mg by mouth daily., Disp: , Rfl: 4  Allergies Amoxicillin and Penicillins  Family History  Problem Relation Age of Onset  . Healthy Mother   . Diabetes Father     Social History Social History   Tobacco Use  . Smoking status: Never Smoker  . Smokeless tobacco: Never Used  Substance Use Topics  . Alcohol use: Yes    Comment: socially  . Drug use: Never    Review of Systems Constitutional: No fever ENT: No sore throat. Cardiovascular: Denies chest pain. Respiratory: Denies shortness of breath. Gastrointestinal: No abdominal pain.   Musculoskeletal: Positive left heel pain. Skin: Negative for rash.  ____________________________________________   PHYSICAL EXAM:  VITAL SIGNS: ED Triage Vitals  Enc Vitals Group     BP 10/06/18 1514 130/60     Pulse Rate 10/06/18 1514 (!) 101     Resp 10/06/18 1514 18     Temp 10/06/18 1514 98.4 F (36.9 C)     Temp Source 10/06/18 1514 Oral  SpO2 10/06/18 1514 100 %     Weight 10/06/18 1515 280 lb (127 kg)     Height 10/06/18 1515 5\' 6"  (1.676 m)     Head Circumference --      Peak Flow --      Pain Score 10/06/18 1515 9     Pain Loc --      Pain Edu? --      Excl. in GC? --     Constitutional: Alert and oriented. Well appearing and in no acute distress. Eyes: Conjunctivae are normal.  ENT      Head: Normocephalic and atraumatic. Cardiovascular: Normal rate, regular rhythm. Grossly normal heart sounds.  Good peripheral circulation. Respiratory: Normal respiratory effort without tachypnea nor retractions. Breath sounds are clear and equal bilaterally. No wheezes, rales,  rhonchi. Musculoskeletal: Steady gait.  Bilateral distal radial pulses equal easily palpated. Except: Left mid to distal plantar heel tenderness to direct palpation, left foot otherwise nontender, no ecchymosis, no edema, full range of motion present. Neurologic:  Normal speech and language. No gross focal neurologic deficits are appreciated. Speech is normal. No gait instability.  Skin:  Skin is warm, dry and intact. No rash noted. Psychiatric: Mood and affect are normal. Speech and behavior are normal. Patient exhibits appropriate insight and judgment   ___________________________________________   LABS (all labs ordered are listed, but only abnormal results are displayed)  Labs Reviewed - No data to display  RADIOLOGY  Dg Foot Complete Left  Result Date: 10/06/2018 CLINICAL DATA:  Pain on the plantar surface of the heel for 1 year, worse over the past month. No known injury. EXAM: LEFT FOOT - COMPLETE 3+ VIEW COMPARISON:  None. FINDINGS: There is no evidence of fracture or dislocation. There is no evidence of arthropathy or other focal bone abnormality. Soft tissues are unremarkable. IMPRESSION: Negative exam. Electronically Signed   By: Drusilla Kannerhomas  Dalessio M.D.   On: 10/06/2018 16:05   ____________________________________________   PROCEDURES Procedures    INITIAL IMPRESSION / ASSESSMENT AND PLAN / ED COURSE  Pertinent labs & imaging results that were available during my care of the patient were reviewed by me and considered in my medical decision making (see chart for details).  Well-appearing patient.  No acute distress.  Left heel pain for at least 1 year.  Suspect plantar fasciitis.  Left foot x-ray negative.  Will treat with Mobic.  Recommend ice, stretches, good supportive shoes.  Recommend follow-up with podiatry.  Work note given. Discussed follow up and return parameters including no resolution or any worsening concerns. Patient verbalized understanding and agreed to plan.    ____________________________________________   FINAL CLINICAL IMPRESSION(S) / ED DIAGNOSES  Final diagnoses:  Plantar fasciitis, left     ED Discharge Orders         Ordered    meloxicam (MOBIC) 15 MG tablet  Daily PRN     10/06/18 1609           Note: This dictation was prepared with Dragon dictation along with smaller phrase technology. Any transcriptional errors that result from this process are unintentional.         Renford DillsMiller, Nathanel Tallman, NP 10/06/18 1623

## 2018-10-06 NOTE — Discharge Instructions (Signed)
Take medication as prescribed. Stretch. Ice. Good supportive shoes.   Follow up with podiatry this week.   Follow up with your primary care physician this week as needed. Return to Urgent care for new or worsening concerns.

## 2019-05-19 ENCOUNTER — Ambulatory Visit (INDEPENDENT_AMBULATORY_CARE_PROVIDER_SITE_OTHER): Payer: 59

## 2019-05-19 ENCOUNTER — Ambulatory Visit
Admission: EM | Admit: 2019-05-19 | Discharge: 2019-05-19 | Disposition: A | Discharge status: Home / Self Care | Payer: 59 | Attending: Urgent Care | Admitting: Urgent Care

## 2019-05-19 ENCOUNTER — Other Ambulatory Visit: Payer: Self-pay

## 2019-05-19 ENCOUNTER — Encounter: Payer: Self-pay | Admitting: Emergency Medicine

## 2019-05-19 DIAGNOSIS — S63611A Unspecified sprain of left index finger, initial encounter: Secondary | ICD-10-CM | POA: Diagnosis not present

## 2019-05-19 DIAGNOSIS — X500XXA Overexertion from strenuous movement or load, initial encounter: Secondary | ICD-10-CM | POA: Diagnosis not present

## 2019-05-19 DIAGNOSIS — M79642 Pain in left hand: Secondary | ICD-10-CM | POA: Diagnosis not present

## 2019-05-19 NOTE — ED Triage Notes (Signed)
Patient states she injured her left pointer finger yesterday. She states her finger bent backwards when she was giving her dog a bath.

## 2019-05-19 NOTE — Discharge Instructions (Signed)
It was very nice seeing you today in clinic. Thank you for entrusting me with your care.   Rest, ice, and elevate hand. Wear splint for stability and comfort. May use Tylenol and/or Ibuprofen as needed for pain.  Make arrangements to follow up with your regular doctor in 1 week for re-evaluation if not improving. If your symptoms/condition worsens, please seek follow up care either here or in the ER. Please remember, our [facility] providers are "right here with you" when you need [place].   Again, it was my pleasure to take care of you today. Thank you for choosing our clinic. I hope that you start to feel better quickly.   Quentin Mulling, MSN, APRN, FNP-C, CEN Advanced Practice Provider [facility]

## 2019-05-19 NOTE — ED Provider Notes (Signed)
[place], [place]   Name: Abigail Hale DOB: 1996/01/16 MRN: [id number] CSN: [id number] PCP: Patient, No Pcp Per  Arrival date and time:  05/19/19 0855  Chief Complaint:  Finger Injury  NOTE: Prior to seeing the patient today, I have reviewed the triage nursing documentation and vital signs. Clinical staff has updated patient's PMH/PSHx, current medication list, and drug allergies/intolerances to ensure comprehensive history available to assist in medical decision making.   History:   HPI: Abigail Hale is a 24 y.o. female who presents today with complaints of pain in the index finger of her LEFT hand following injury that occurred yesterday (05/18/2019). Patient reports that she was bathing her dog when the animal fell causing hyperextension of her finger. Pain and swelling overlying the proximal phalanx. AROM is reduced secondary to pain and swelling; PROM normal with intact sensation. She denies previous injuries to this digit. She has a PMH (+) for remote thumb fractures. Despite her symptoms, patient has not taken any over the counter interventions to help improve/relieve her reported symptoms at home.  She presents today with pain rated 8/10.   Past Medical History:  Diagnosis Date  . Depression   . Hypertension   . Insomnia   . PCOS (polycystic ovarian syndrome)     Past Surgical History:  Procedure Laterality Date  . TONSILLECTOMY      Family History  Problem Relation Age of Onset  . Healthy Mother   . Diabetes Father     Social History   Tobacco Use  . Smoking status: Never Smoker  . Smokeless tobacco: Never Used  Substance Use Topics  . Alcohol use: Yes    Comment: socially  . Drug use: Never    There are no problems to display for this patient.   Home Medications:    Current Meds  Medication Sig  . QUEtiapine (SEROQUEL) 50 MG tablet Take by mouth.    Allergies:   Amoxicillin and Penicillins  Review of Systems (ROS):  Review of systems NEGATIVE  unless otherwise noted in narrative H&P section.   Vital Signs: Today's Vitals   05/19/19 0919 05/19/19 0920 05/19/19 0922  BP:   130/86  Pulse:   94  Resp:   18  Temp:   98 F (36.7 C)  TempSrc:   Oral  SpO2:   100%  Weight:  290 lb (131.5 kg)   Height:  5\' 6"  (1.676 m)   PainSc: 8       Physical Exam: Physical Exam  Constitutional: She is oriented to person, place, and time and well-developed, well-nourished, and in no distress.  HENT:  Head: Normocephalic and atraumatic.  Eyes: Pupils are equal, round, and reactive to light.  Cardiovascular: Normal rate and intact distal pulses.  Pulmonary/Chest: Effort normal. No respiratory distress.  Musculoskeletal:     Left hand: Swelling and tenderness (marked location) present. No deformity. Decreased range of motion. Normal strength. Normal sensation. There is no disruption of two-point discrimination.       Hands:     Comments: (+) PMS with normal color, temperature, and capillary refill. Patient able to make a fist.   Neurological: She is alert and oriented to person, place, and time. Gait normal.  Skin: Skin is warm and dry. No rash noted. She is not diaphoretic.  Psychiatric: Mood, memory, affect and judgment normal.  Nursing note and vitals reviewed.   Urgent Care Treatments / Results:   Orders Placed This Encounter  Procedures  . DG Finger  Index Left  . Apply finger splint static    LABS: PLEASE NOTE: all labs that were ordered this encounter are listed, however only abnormal results are displayed. Labs Reviewed - No data to display  EKG: -None  RADIOLOGY: DG Finger Index Left  Result Date: 05/19/2019 CLINICAL DATA:  Injury 05/18/2019. EXAM: LEFT INDEX FINGER 2+V COMPARISON:  03/19/2009. FINDINGS: No evidence of fracture or dislocation. No focal abnormality identified. No radiopaque foreign body. IMPRESSION: No acute abnormality. Electronically Signed   By: Maisie Fus  Register   On: 05/19/2019 09:58     PROCEDURES: Procedures  MEDICATIONS RECEIVED THIS VISIT: Medications - No data to display  PERTINENT CLINICAL COURSE NOTES/UPDATES:   Initial Impression / Assessment and Plan / Urgent Care Course:  Pertinent labs & imaging results that were available during my care of the patient were personally reviewed by me and considered in my medical decision making (see lab/imaging section of note for values and interpretations).  Abigail Hale is a 24 y.o. female who presents to [facility] today with complaints of Finger Injury  Patient is well appearing overall in clinic today. She does not appear to be in any acute distress. Presenting symptoms (see HPI) and exam as documented above. Pain following hyperextension injury that occurred yesterday. Diagnostic radiographs of the LEFT index revealed no acute abnormalities; no fracture, dislocation, or effusion. Exam consistent with sprain. Will place patient in finger splint for comfort and support. Patient advised to rest, ice, and elevate her hand. May use Tylenol and/or Ibuprofen as needed for pain.   Current clinical condition warrants patient being out of work in order to recover from her current injury/illness. She was provided with the appropriate documentation to provide to her place of employment that will allow for her to RTW on 05/21/2019 with finger splint in place if still having pain.   Discussed follow up with primary care physician in 1 week for re-evaluation. I have reviewed the follow up and strict return precautions for any new or worsening symptoms. Patient is aware of symptoms that would be deemed urgent/emergent, and would thus require further evaluation either here or in the emergency department. At the time of discharge, she verbalized understanding and consent with the discharge plan as it was reviewed with her. All questions were fielded by provider and/or clinic staff prior to patient discharge.    Final Clinical  Impressions / Urgent Care Diagnoses:   Final diagnoses:  Sprain of left index finger, unspecified site of digit, initial encounter    New Prescriptions:  [place] Controlled Substance Registry consulted? Not Applicable  No orders of the defined types were placed in this encounter.   Recommended Follow up Care:  Patient encouraged to follow up with the following provider within the specified time frame, or sooner as dictated by the severity of her symptoms. As always, she was instructed that for any urgent/emergent care needs, she should seek care either here or in the emergency department for more immediate evaluation.  Follow-up Information    PCP In 1 week.   Why: For wound re-check        NOTE: This note was prepared using Dragon dictation software along with smaller phrase technology. Despite my best ability to proofread, there is the potential that transcriptional errors may still occur from this process, and are completely unintentional.     Verlee Monte, NP 05/20/19 2149

## 2019-05-28 IMAGING — CT CT HEAD W/O CM
3 series · 16 of 47 positions shown, 19 images · non-contrast
Comparison: None.

CLINICAL DATA: Dizziness and possible loss of consciousness post
facial trauma.

EXAM:
CT HEAD WITHOUT CONTRAST
TECHNIQUE: Contiguous axial images were obtained from the base of the skull
through the vertex without intravenous contrast.

[Series 2: head wo · axial · 0.42mm/px · z∈[-130,-5]mm · 10 of 31 slices shown, 13 images]
[im 3/31  brain]
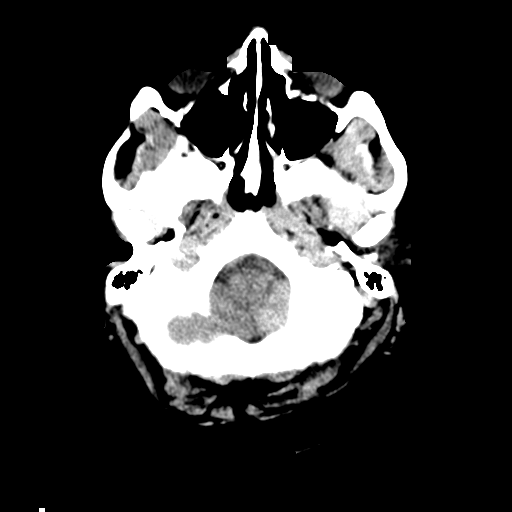
[im 3/31  bone]
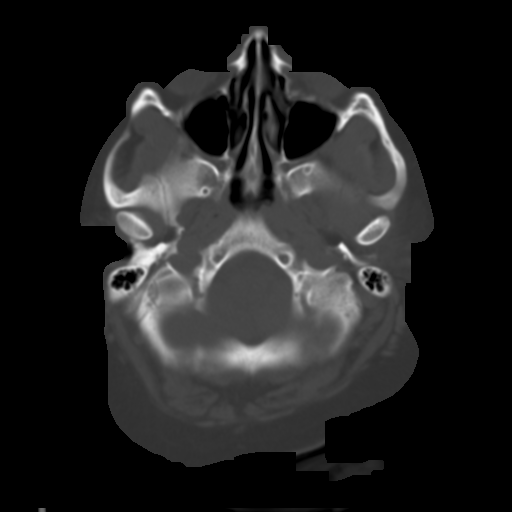
[im 6/31  brain]
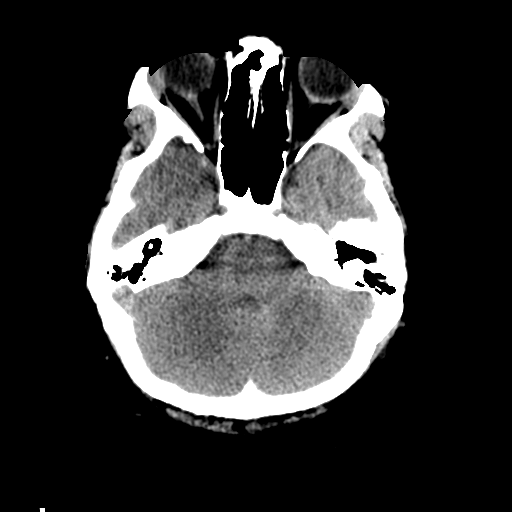
[im 9/31  brain]
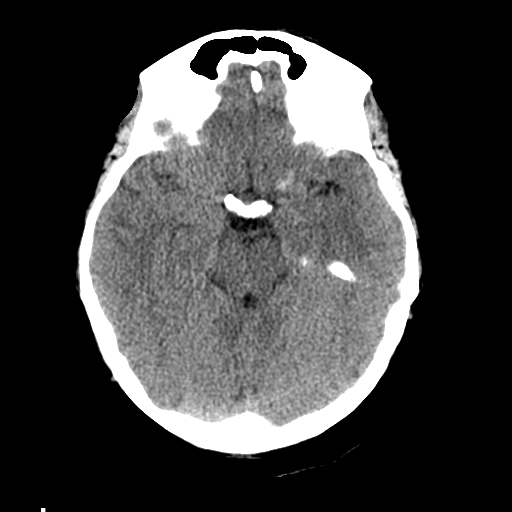
[im 11/31  brain]
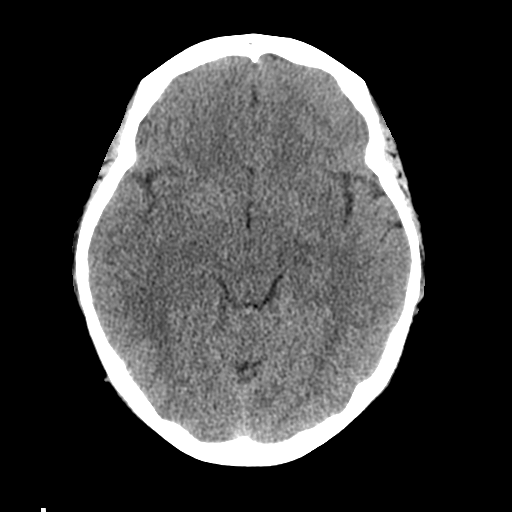
[im 14/31  brain]
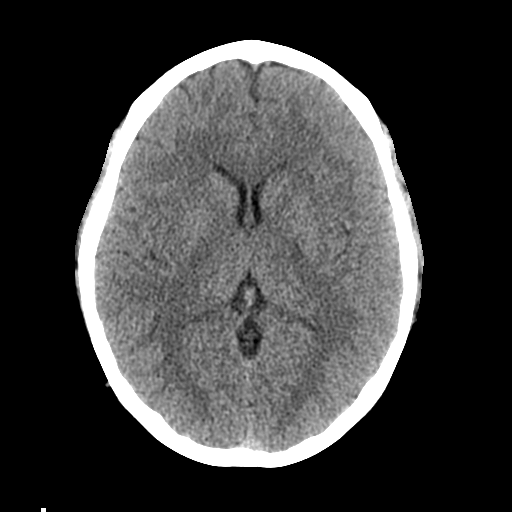
[im 14/31  bone]
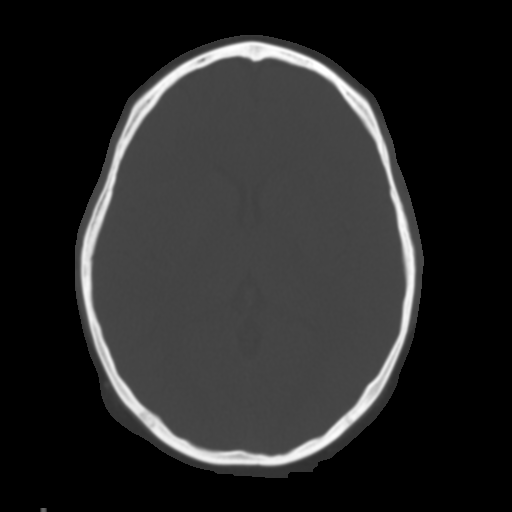
[im 17/31  brain]
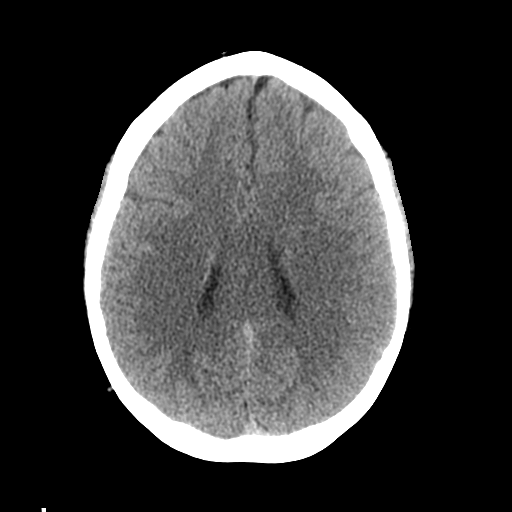
[im 20/31  brain]
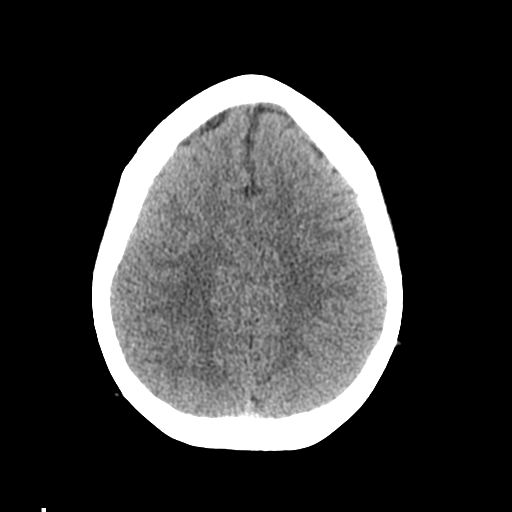
[im 23/31  brain]
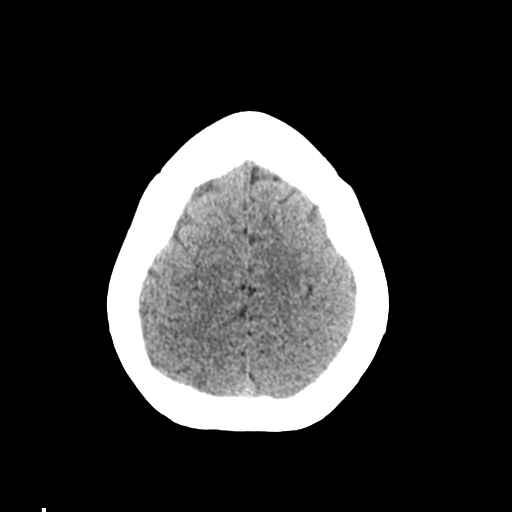
[im 25/31  brain]
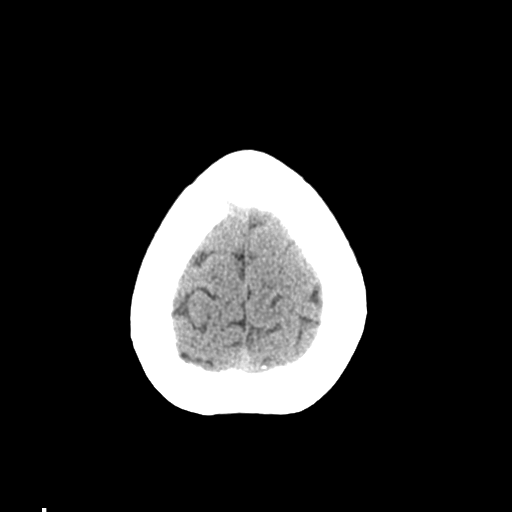
[im 25/31  bone]
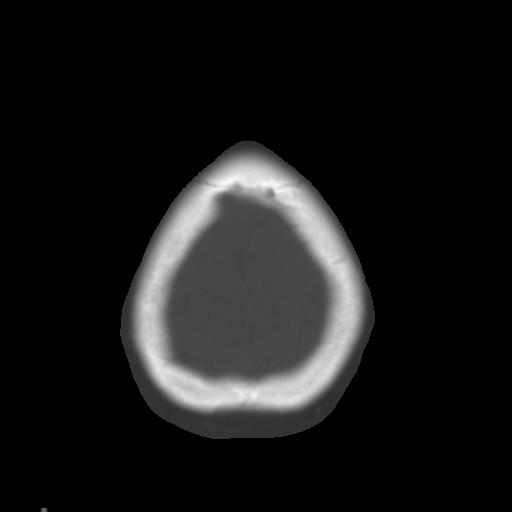
[im 28/31  brain]
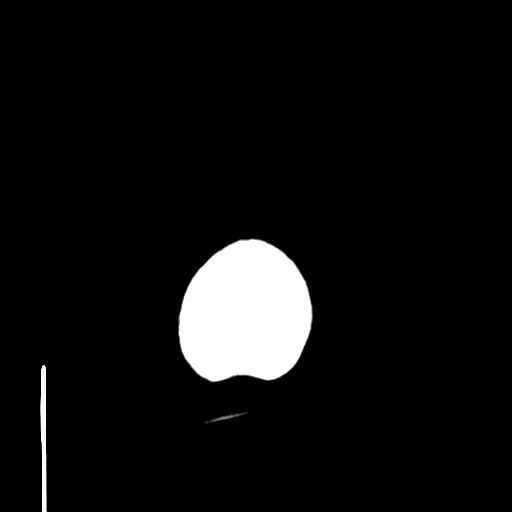

[Series 5: coronal soft tissue · coronal · 0.30mm/px · 3 of 65 slices shown]
[im 22/65  brain]
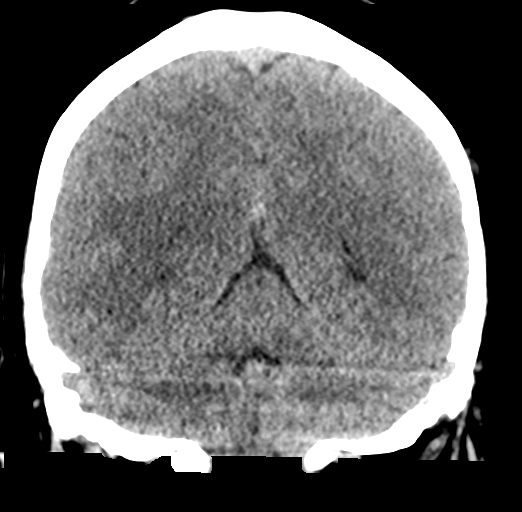
[im 29/65  brain]
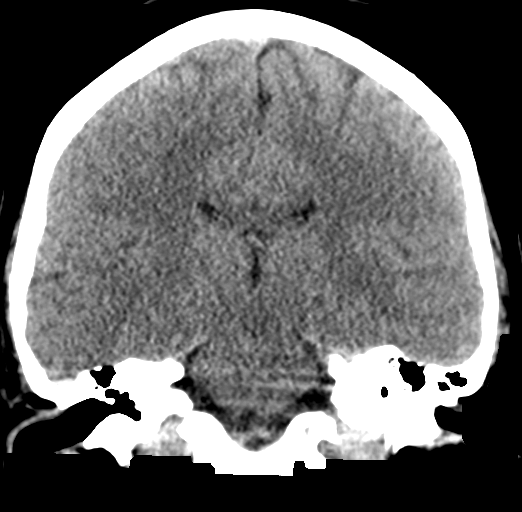
[im 36/65  brain]
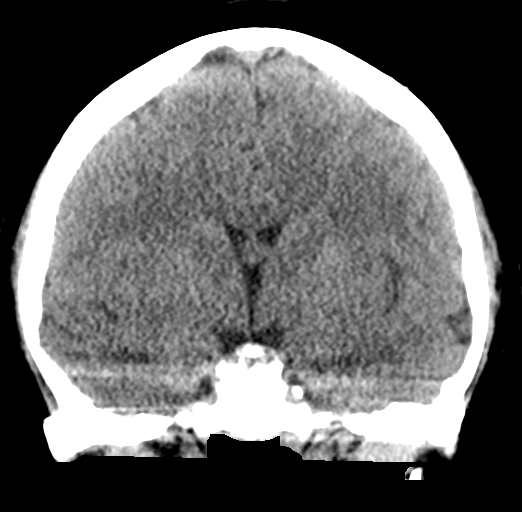

[Series 6: sagittal soft tissue · sagittal · 0.30mm/px · 3 of 52 slices shown]
[im 18/52  brain]
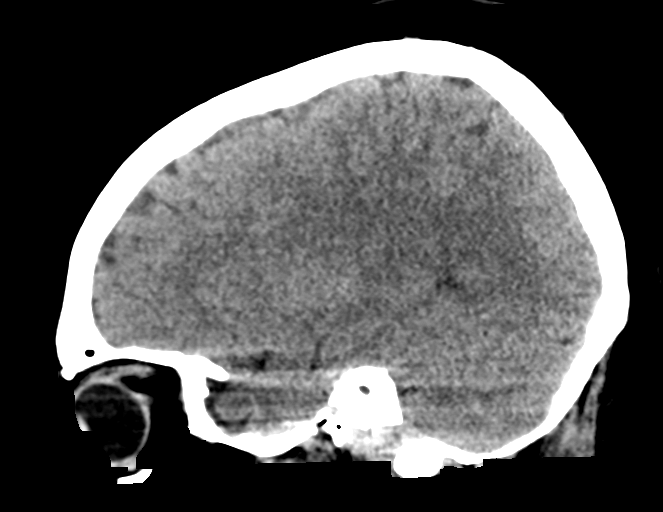
[im 26/52  brain]
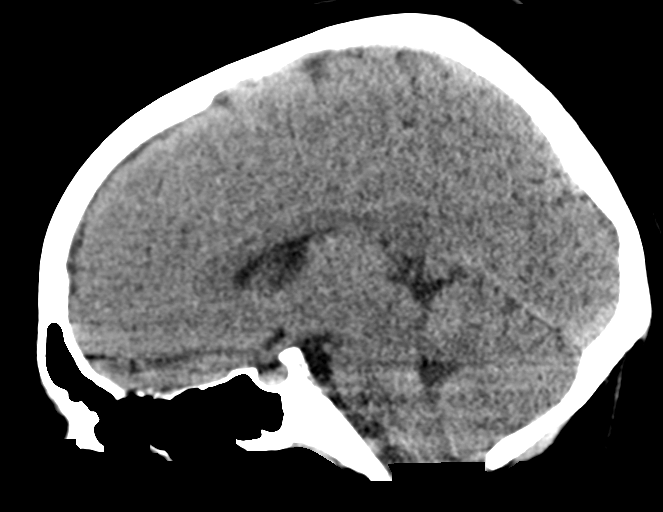
[im 35/52  brain]
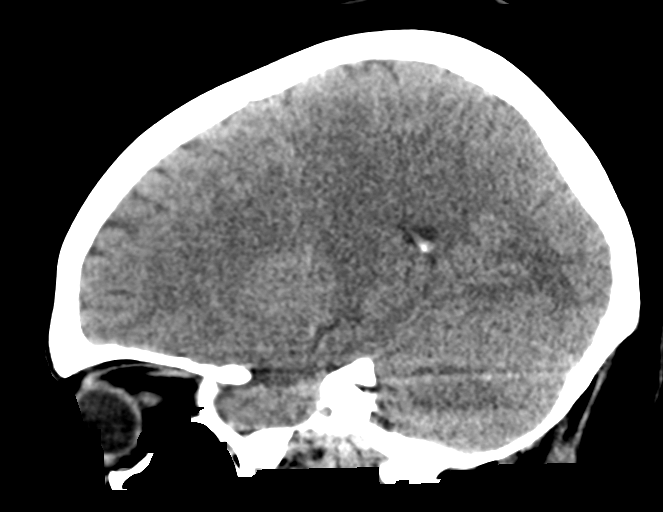

[16 of 47 positions shown; findings below may reference images not displayed]

FINDINGS: Brain: No evidence of acute infarction, hemorrhage, hydrocephalus,
extra-axial collection or mass lesion/mass effect.

Vascular: No hyperdense vessel or unexpected calcification.

Skull: Normal. Negative for fracture or focal lesion.

Sinuses/Orbits: No acute finding.

Other: None.
IMPRESSION: No acute intracranial abnormality.

## 2019-06-01 DIAGNOSIS — G4482 Headache associated with sexual activity: Secondary | ICD-10-CM | POA: Insufficient documentation

## 2019-06-18 ENCOUNTER — Ambulatory Visit: Admit: 2019-06-18 | Discharge: 2019-06-19 | Payer: BLUE CROSS/BLUE SHIELD

## 2019-06-26 DIAGNOSIS — Z7189 Other specified counseling: Secondary | ICD-10-CM | POA: Insufficient documentation

## 2019-06-26 DIAGNOSIS — F410 Panic disorder [episodic paroxysmal anxiety] without agoraphobia: Secondary | ICD-10-CM | POA: Insufficient documentation

## 2019-06-26 DIAGNOSIS — E669 Obesity, unspecified: Secondary | ICD-10-CM | POA: Insufficient documentation

## 2019-06-26 DIAGNOSIS — N938 Other specified abnormal uterine and vaginal bleeding: Secondary | ICD-10-CM | POA: Insufficient documentation

## 2019-07-07 MED ORDER — METFORMIN 500 MG TABLET
0 days
Start: 2019-07-07 — End: ?

## 2019-07-31 DIAGNOSIS — R7401 Elevation of levels of liver transaminase levels: Secondary | ICD-10-CM | POA: Insufficient documentation

## 2019-12-23 ENCOUNTER — Ambulatory Visit: Payer: Self-pay

## 2020-03-02 ENCOUNTER — Encounter
Admit: 2020-03-02 | Discharge: 2020-03-03 | Payer: PRIVATE HEALTH INSURANCE | Attending: Dermatology | Primary: Dermatology

## 2020-03-02 DIAGNOSIS — L732 Hidradenitis suppurativa: Principal | ICD-10-CM

## 2020-03-02 DIAGNOSIS — Z79899 Other long term (current) drug therapy: Principal | ICD-10-CM

## 2020-03-02 MED ORDER — HUMIRA PEN CITRATE FREE 40 MG/0.4 ML
SUBCUTANEOUS | 11 refills | 28.00000 days | Status: CP
Start: 2020-03-02 — End: 2020-04-01

## 2020-03-02 MED ORDER — DOXYCYCLINE HYCLATE 100 MG TABLET,DELAYED RELEASE
ORAL_TABLET | Freq: Two times a day (BID) | ORAL | 0 refills | 30.00000 days | Status: CP
Start: 2020-03-02 — End: 2020-04-01

## 2020-03-02 MED ORDER — HUMIRA PEN CITRATE FREE STARTER PACK FOR CROHN'S/UC/HS 3 X 80 MG/0.8 ML
ORAL | 0 refills | 0.00000 days | Status: CP
Start: 2020-03-02 — End: ?
  Filled 2020-03-17: qty 3, 28d supply, fill #0

## 2020-03-03 DIAGNOSIS — L732 Hidradenitis suppurativa: Principal | ICD-10-CM

## 2020-03-15 MED ORDER — EMPTY CONTAINER
3 refills | 0 days
Start: 2020-03-15 — End: ?

## 2020-03-15 NOTE — Unmapped (Signed)
I reviewed this patient case and all documentation provided by the learner and was readily available for consultation during their interaction with the patient.  I agree with the assessment and plan listed below.    Meghan A Katrinka Blazing   [facility] Specialty Pharmacist        Will send Humira injection instructions and Humira injection video via MyChart. [facility]   Patient Onboarding/Medication Counseling    Ms.Ulatowski is a 25 y.o. female with Hidradenitis suppurativa who I am counseling today on initiation of therapy.  I am speaking to the patient.    Was a Nurse, learning disability used for this call? No    Verified patient's date of birth / HIPAA.    Specialty medication(s) to be sent: Inflammatory Disorders: Humira      Non-specialty medications/supplies to be sent: Transport planner      Medications not needed at this time: n/a         Humira (adalimumab)    Medication & Administration     Dosage: Hidradenitis supprativa: Inject 160mg  under the skin on day 1, 80mg  on day 15, then 40mg  every 7 days starting on day 29    Lab tests required prior to treatment initiation:  ??? Tuberculosis: Tuberculosis screening resulted in a non-reactive Quantiferon TB Gold assay.  ??? Hepatitis B: Hepatitis B serology studies are complete and non-reactive.    Administration:     Prefilled auto-injector pen  1. Gather all supplies needed for injection on a clean, flat working surface: medication pen removed from packaging, alcohol swab, sharps container, etc.  2. Look at the medication label - look for correct medication, correct dose, and check the expiration date  3. Look at the medication - the liquid visible in the window on the side of the pen device should appear clear and colorless  4. Lay the auto-injector pen on a flat surface and allow it to warm up to room temperature for at least 30-45 minutes  5. Select injection site - you can use the front of your thigh or your belly (but not the area 2 inches around your belly button); if someone else is giving you the injection you can also use your upper arm in the skin covering your triceps muscle  6. Prepare injection site - wash your hands and clean the skin at the injection site with an alcohol swab and let it air dry, do not touch the injection site again before the injection  7. Pull the 2 safety caps straight off - gray/white to uncover the needle cover and the plum cap to uncover the plum activator button, do not remove until immediately prior to injection and do not touch the white needle cover  8. Gently squeeze the area of cleaned skin and hold it firmly to create a firm surface at the selected injection site  9. Put the white needle cover against your skin at the injection site at a 90 degree angle, hold the pen such that you can see the clear medication window  10. Press down and hold the pen firmly against your skin, press the plum activator button to initiate the injection, there will be a click when the injection starts  11. Continue to hold the pen firmly against your skin for about 10-15 seconds - the window will start to turn solid yellow  12. To verify the injection is complete after 10-15 seconds, look and ensure the window is solid  yellow and then pull the pen away from your skin  13. Dispose of the used auto-injector pen immediately in your sharps disposal container the needle will be covered automatically  14. If you see any blood at the injection site, press a cotton ball or gauze on the site and maintain pressure until the bleeding stops, do not rub the injection site    Adherence/Missed dose instructions:  If your injection is given more than 3 days after your scheduled injection date ??? consult your pharmacist for additional instructions on how to adjust your dosing schedule.    Goals of Therapy     - Reduce the frequency and severity of new lesions  - Minimize pain and suppuration  - Prevent disease progression and limit scarring  - Maintenance of effective psychosocial functioning    Side Effects & Monitoring Parameters     ??? Injection site reaction (redness, irritation, inflammation localized to the site of administration)  ??? Signs of a common cold ??? minor sore throat, runny or stuffy nose, etc.  ??? Upset stomach  ??? Headache    The following side effects should be reported to the provider:  ??? Signs of a hypersensitivity reaction ??? rash; hives; itching; red, swollen, blistered, or peeling skin; wheezing; tightness in the chest or throat; difficulty breathing, swallowing, or talking; swelling of the mouth, face, lips, tongue, or throat; etc.  ??? Reduced immune function ??? report signs of infection such as fever; chills; body aches; very bad sore throat; ear or sinus pain; cough; more sputum or change in color of sputum; pain with passing urine; wound that will not heal, etc.  Also at a slightly higher risk of some malignancies (mainly skin and blood cancers) due to this reduced immune function.  o In the case of signs of infection ??? the patient should hold the next dose of Humira?? and call your primary care provider to ensure adequate medical care.  Treatment may be resumed when infection is treated and patient is asymptomatic.  ??? Changes in skin ??? a new growth or lump that forms; changes in shape, size, or color of a previous mole or marking  ??? Signs of unexplained bruising or bleeding ??? throwing up blood or emesis that looks like coffee grounds; black, tarry, or bloody stool; etc.  ??? Signs of new or worsening heart failure ??? shortness of breath; sudden weight gain; heartbeat that is not normal; swelling in the arms or legs that is new or worse      Contraindications, Warnings, & Precautions     ??? Have your bloodwork checked as you have been told by your prescriber  ??? Talk with your doctor if you are pregnant, planning to become pregnant, or breastfeeding  ??? Discuss the possible need for holding your dose(s) of Humira?? when a planned procedure is scheduled with the prescriber as it may delay healing/recovery timeline       Drug/Food Interactions     ??? Medication list reviewed in Epic. The patient was instructed to inform the care team before taking any new medications or supplements. No drug interactions identified.   ??? Talk with you prescriber or pharmacist before receiving any live vaccinations while taking this medication and after you stop taking it    Storage, Handling Precautions, & Disposal     ??? Store this medication in the refrigerator.  Do not freeze  ??? If needed, you may store at room temperature for up to 14 days  ??? Store in  original packaging, protected from light  ??? Do not shake  ??? Dispose of used syringes/pens in a sharps disposal container            Current Medications (including OTC/herbals), Comorbidities and Allergies     Current Outpatient Medications   Medication Sig Dispense Refill   ??? doxycycline (DORYX) 100 MG EC tablet Take 1 tablet (100 mg total) by mouth Two (2) times a day. 60 tablet 0   ??? HUMIRA PEN CITRATE FREE 40 MG/0.4 ML Inject the contents of 1 pen (40 mg total) under the skin every seven (7) days. 4 each 11   ??? HUMIRA PEN CITRATE FREE STARTER PACK FOR CROHN'S/UC/HS 3 X 80 MG/0.8 ML Inject the contents of 2 pens (160 mg) under the skin on day 1, THEN inject 1 pen (80 mg) on day 15. THEN inject 1 pen (40 mg) every week 3 each 0   ??? hydrOXYzine (ATARAX) 25 MG tablet Take 25 mg by mouth.     ??? ibuprofen (MOTRIN) 600 MG tablet Take 600 mg by mouth.     ??? metFORMIN (GLUCOPHAGE) 500 MG tablet Take 500 mg by mouth.     ??? sertraline (ZOLOFT) 25 MG tablet Take 25 mg by mouth daily.       No current facility-administered medications for this visit.       Allergies   Allergen Reactions   ??? Amoxicillin Hives   ??? Penicillins Hives       There is no problem list on file for this patient.      Reviewed and up to date in Epic.    Appropriateness of Therapy     Is medication and dose appropriate based on diagnosis? Yes    Prescription has been clinically reviewed: Yes    Baseline Quality of Life Assessment      How many days over the past month did your HS  keep you from your normal activities? For example, brushing your teeth or getting up in the morning. Leisions are starting to come back while on doxycycline.     Financial Information     Medication Assistance provided: Prior Authorization and Copay Assistance    Anticipated copay of $0 / 28 days for start and maintenance reviewed with patient. Verified delivery address.    Delivery Information     Scheduled delivery date: 03/17/20    Expected start date: 03/20/20    Medication will be delivered via Same Day Courier to the prescription address in [facility].  This shipment will not require a signature.      Explained the services we provide at [facility] and that each month we would call to set up refills.  Stressed importance of returning phone calls so that we could ensure they receive their medications in time each month.  Informed patient that we should be setting up refills 7-10 days prior to when they will run out of medication.  A pharmacist will reach out to perform a clinical assessment periodically.  Informed patient that a welcome packet and a drug information handout will be sent.      Patient verbalized understanding of the above information as well as how to contact the pharmacy at [phone] option 4 with any questions/concerns.  The pharmacy is open Monday through Friday 8:30am-4:30pm.  A pharmacist is available 24/7 via pager to answer any clinical questions they may have.    Patient Specific Needs     - Does the patient  have any physical, cognitive, or cultural barriers? No    - Patient prefers to have medications discussed with  Patient     - Is the patient or caregiver able to read and understand education materials at a high school level or above? Yes    - Patient's primary language is  English     - Is the patient high risk? No    - Does the patient require a Care Management Plan? No     - Does the patient require physician intervention or other additional services (i.e. nutrition, smoking cessation, social work)? No      Jakhari [facility] Specialty Pharmacist

## 2020-03-15 NOTE — Unmapped (Signed)
[facility] Specialty Medication Onboarding    Specialty Medication: Humira Starter and Maintenance  Prior Authorization: Approved   Financial Assistance: Yes - copay card approved as secondary   Final Copay/Day Supply: $0 / 28 days for start and maintenance    Insurance Restrictions: None     Notes to Pharmacist:     The triage team has completed the benefits investigation and has determined that the patient is able to fill this medication at [facility]. Please contact the patient to complete the onboarding or follow up with the prescribing physician as needed.

## 2020-03-17 MED FILL — EMPTY CONTAINER: 120 days supply | Qty: 1 | Fill #0

## 2020-04-04 NOTE — Unmapped (Signed)
Patient called stating she had a severe allergic reaction to Humira and is scared to continue therapy, would like to speak with provider to see what would be the next steps in tx.

## 2020-04-05 ENCOUNTER — Encounter
Admit: 2020-04-05 | Discharge: 2020-04-06 | Payer: PRIVATE HEALTH INSURANCE | Attending: Obstetrics & Gynecology | Primary: Obstetrics & Gynecology

## 2020-04-05 DIAGNOSIS — K760 Fatty (change of) liver, not elsewhere classified: Secondary | ICD-10-CM | POA: Insufficient documentation

## 2020-04-05 DIAGNOSIS — N979 Female infertility, unspecified: Principal | ICD-10-CM

## 2020-04-05 DIAGNOSIS — N939 Abnormal uterine and vaginal bleeding, unspecified: Principal | ICD-10-CM

## 2020-04-05 DIAGNOSIS — M6289 Other specified disorders of muscle: Principal | ICD-10-CM

## 2020-04-05 DIAGNOSIS — N915 Oligomenorrhea, unspecified: Principal | ICD-10-CM

## 2020-04-05 DIAGNOSIS — Z6841 Body Mass Index (BMI) 40.0 and over, adult: Principal | ICD-10-CM

## 2020-04-05 LAB — FOLLICLE STIMULATING HORMONE: FOLLICLE STIMULATING HORMONE: 6.3 m[IU]/mL

## 2020-04-05 LAB — PROLACTIN: PROLACTIN: 8.4 ng/mL

## 2020-04-05 LAB — TSH: THYROID STIMULATING HORMONE: 1.403 u[IU]/mL (ref 0.550–4.780)

## 2020-04-05 LAB — ESTRADIOL(ESTROGEN) LEVEL: ESTRADIOL LEVEL: 95 pg/mL

## 2020-04-05 MED ORDER — ETONOGESTREL 0.12 MG-ETHINYL ESTRADIOL 0.015 MG/24 HR VAGINAL RING
VAGINAL | 11 refills | 0 days | Status: CP
Start: 2020-04-05 — End: 2021-04-05

## 2020-04-05 NOTE — Unmapped (Signed)
Thank you for receiving care at [facility]) ! If you have any questions or concerns you have two ways to contact our team:    1) Nurses line: [phone]    2) MyChart messages: These messages are checked by the nurses during normal business hours 8:30 am-4:30 pm Monday-Friday every 24-48 hours and are for non-urgent, non-emergent concerns. You may be asked to return for a follow up visit if it is deemed your questions are best handled in the clinic setting.    __________________________________________________________________________      1. Start Nuvaring for management of your period so you can protect the lining of the uterus    2. Obtain pelvic ultrasound    3. Get your labs drawn today     4. Refer to Fertility group at [facility]    5. Pelvic floor PT    Haskel Schroeder, PT  [facility] PT dept.  [address]  [place], [place] [address]  [facility]: [phone]

## 2020-04-05 NOTE — Unmapped (Signed)
[facility]    Date: April 05, 2020  Patient Name: Danielle Nunez  MRN: [id number]  PCP: [facility]  Referring Provider: Georgann Housekeeper*    Assessment and Plan:      Problem List Items Addressed This Visit     None      Visit Diagnoses     Abnormal uterine bleeding (AUB)    -  Primary    Relevant Orders    [place] Endovaginal (Non-OB) [phone])    FSH    Estradiol    Antimullerian Hormone (AMH)    Testosterone, free, total    TSH    DHEA-sulfate    17OH Progesterone    Prolactin    AMB REFERRAL TO PHYSICAL THERAPY    Oligomenorrhea, unspecified type        Primary female infertility        Relevant Orders    Ambulatory referral to Reproductive Endocrinology    Morbid obesity with BMI of 45.0-49.9, adult (CMS-HCC)        Pelvic floor dysfunction              25 y.o. female No obstetric history on file. with:    1. PCOS:  --unclear if she has formal diagnosis  --Test, DHEAS, 17OHP ordered  --TVUS ordered  --discussed need for endometrial protection, no need for EMB unless she continues to have AUB despite hormonal suppression or she forgoes hormonal suppression at this time  --discussed association with HTN and DM  --patient amenable to combined hormonal therapy until she sees REI with Nuvaring  --discussed fertility and PCOS can improve with weight loss     2. Primary infertility x2 years  --as above and TSH (oligomenorrhea), PRL (headaches, vision changes), ovarian testing   --referred to [facility] for further management     3. PFTM:  Pelvic pain may have many sources, and at times, pelvic floor muscle spasm can be a major contributor.  The origin of pelvic floor muscle spasm can be multifactorial, including primary, reactive to a different pain source, trauma, or even part of a centralized pain syndrome.  With regard to pelvic floor muscle spasm, her clinical history and exam findings support this diagnosis.  Our primary intervention is typically pelvic floor physical therapy, and for some patients, the addition of local or orally administered muscle relaxants, or centrally acting pain medications (e.g. Gabapentin) can be effective. She understands that her pelvic pain may initially worsen with PT but that she will not have improvement of her pain until completing about 3 months of therapy.  --At this point, I will refer her for pelvic floor PT, and I asked that she call in 1 week if no appointment has been scheduled.     To follow up with general GYN provider moving forward     Orders Placed This Encounter   Procedures   ??? [place] Endovaginal (Non-OB) [phone])     Standing Status:   Future     Standing Expiration Date:   04/05/2021     Order Specific Question:   Performed at     Answer:   [facility]     Order Specific Question:   What is the patient's sedation requirement?     Answer:   No Sedation     Order Specific Question:   Reason for Exam:     Answer:   amenorrhea, evaluate PCOS     Order Specific Question:  Is the patient pregnant?     Answer:   No   ??? FSH     Standing Status:   Future     Standing Expiration Date:   04/05/2021     Order Specific Question:   Release to patient     Answer:   Immediate   ??? Estradiol     Standing Status:   Future     Standing Expiration Date:   04/05/2021     Order Specific Question:   Release to patient     Answer:   Immediate   ??? Antimullerian Hormone (AMH)     Standing Status:   Future     Standing Expiration Date:   04/05/2021   ??? Testosterone, free, total     Standing Status:   Future     Standing Expiration Date:   04/05/2021     Order Specific Question:   Release to patient     Answer:   Immediate   ??? TSH     Standing Status:   Future     Standing Expiration Date:   04/05/2021   ??? DHEA-sulfate     Standing Status:   Future     Standing Expiration Date:   04/05/2021     Order Specific Question:   Release to patient     Answer:   Immediate   ??? 17OH Progesterone     Standing Status:   Future     Standing Expiration Date:   04/05/2021     Order Specific Question:   Release to patient     Answer:   Immediate   ??? Prolactin     Standing Status:   Future     Standing Expiration Date:   04/05/2021     Order Specific Question:   Release to patient     Answer:   Immediate   ??? AMB REFERRAL TO PHYSICAL THERAPY     Standing Status:   Future     Standing Expiration Date:   04/05/2021     Referral Priority:   Routine     Referral Type:   Generic Referral     Number of Visits Requested:   1   ??? Ambulatory referral to Reproductive Endocrinology     Standing Status:   Future     Standing Expiration Date:   04/05/2021     Referral Priority:   Routine     Referral Type:   Generic Referral     Referral Reason:   Specialty Services Required     Number of Visits Requested:   1     Requested Prescriptions     Signed Prescriptions Disp Refills   ??? etonogestreL-ethinyl estradioL (NUVARING) 0.12-0.015 mg/24 hr vaginal ring 1 each 11     Sig: Insert 1 each into the vagina every twenty-one (21) days. Insert one (1) ring vaginally and leave in place for three (3) weeks, then remove for one (1) week.       No follow-ups on file.       I personally spent 60 minutes face-to-face and non-face-to-face in the care of this patient preparing to see the patient (review of tests and/or imaging, reviewing other documentation in the medical record), obtaining and/or reviewing separately obtained history, performing a medically appropriate examination and/or evaluation, counseling and educating the patient/family/caregiver, ordering medications, tests, or procedures, referring and communicating with other health care professionals  and documenting clinical information in the electronic health record.    Dasouki  Abu-Alnadi, Concha [place], MD    Subjective:        HPI:  Danielle Nunez is a 25 y.o. female No obstetric history on file. seen in consultation at the request of Georgann Housekeeper* for evaluation and recommendations regarding Her PCOS, infertility, AUB.     Periods are irregular and she is having <9 periods in the year. She has hair growth under the chin and chest. She has also noticed hair thinning. She has been diagnosed with HTN and prediabetes. She is not currently on birth control but she is looking to conceive. She was taking OCPs cylically and she stopped due to side effects of headaches and significant weight gain. This was at age [age] and believes she gains at least 30-40 lbs.     She was prescribed medroxyprogesterone for 10 days with period but did not take for full 10 days.     She has headaches, vision changes. No nipple discharge.     She is sexually active with a female partner. She has been having unprotected sex for two years. Partner has no children.     She occasionally has pain with sex that is with deep penetration.     IMAGING: per patient had TAS many years ago but not recent.     PAST OBSTETRIC HISTORY: No obstetric history on file.  PAST GYNECOLOGIC HISTORY:   Menarche at 10 years.  She has not an abnormal pap smear.   She denies a history of STIs.    Her current method of birth control is none.  PAST MEDICAL HISTORY:  has a past medical history of Hidradenitis suppurativa, Hypertension, and PCOS (polycystic ovarian syndrome).  PAST SURGICAL HISTORY: No past surgical history on file.  SOCIAL HISTORY:    Tobacco: no    Drugs:  no    Alcohol:  yes   Sexual activity:  yes   Working as a [employer] manager   FAMILY HISTORY: family history is not on file.   Bleeding/Clotting disorder: no    Breast cancer: no   Uterine cancer: no    Ovarian cancer: no    Colon cancer: no    Anesthesia problems: no   MEDICATIONS:   Current Outpatient Medications:   ???  empty container Misc, Use as directed to dispose of Humira pens, Disp: 1 each, Rfl: 3  ???  etonogestreL-ethinyl estradioL (NUVARING) 0.12-0.015 mg/24 hr vaginal ring, Insert 1 each into the vagina every twenty-one (21) days. Insert one (1) ring vaginally and leave in place for three (3) weeks, then remove for one (1) week., Disp: 1 each, Rfl: 11  ???  HUMIRA PEN CITRATE FREE STARTER PACK FOR CROHN'S/UC/HS 3 X 80 MG/0.8 ML, Inject the contents of 2 pens (160 mg) under the skin on day 1, THEN inject 1 pen (80 mg) on day 15. THEN inject 1 pen (40 mg) every week, Disp: 3 each, Rfl: 0  ???  hydrOXYzine (ATARAX) 25 MG tablet, Take 25 mg by mouth., Disp: , Rfl:   ???  ibuprofen (MOTRIN) 600 MG tablet, Take 600 mg by mouth., Disp: , Rfl:   ???  metFORMIN (GLUCOPHAGE) 500 MG tablet, Take 500 mg by mouth., Disp: , Rfl:   ???  sertraline (ZOLOFT) 25 MG tablet, Take 25 mg by mouth daily., Disp: , Rfl:      Review of Systems - Negative except what is noted above.            Objective:      Vital  Signs for this encounter:  BMI: Body mass index is 48.42 kg/m??.  Ht 167.6 cm (5' 6)  - Wt (!) 136.1 kg (300 lb)  - LMP 03/22/2020  - BMI 48.42 kg/m??   There were no vitals filed for this visit.  Constitutional: Well-developed, well-nourished female in no acute distress  Neurological: Alert and oriented to person, place, and time  Psychiatric: Mood and affect appropriate  Skin: No rashes or lesions  Gastrointestinal: Soft, nontender, nondistended, hair growth on lower abdomen noted. No masses or hernias appreciated, no hepatosplenomegaly. No fluid wave. No rebound or guarding.  Genitourinary:       Vulva: Clitoris, prepuce, labia minora normal, evidence of hidradenitis along mons pubis and labia majoa normal. No erythema or allodynia.      Urethra: Midline, no masses, nontender    Bladder: Well-suspended, nontender   Vagina:  Normally rugated, no lesions.    Cervix: No lesions, normal size and consistency; no cervical motion tenderness    Uterus: 8 week size and contour; smooth, mobile, nontender    Muscle Right Left Reproduces pain?   Levator + + yes   Obturator - + yes   Piriformis + + yes     Adnexa/Parametria: no masses; no parametrial nodularity  Perineum/Anus: no lesions  Rectal: deferred

## 2020-04-06 NOTE — Unmapped (Signed)
Returned call to patient regarding message she left on nurse line indicating she had side effects from her Humira and needs information as to whether she needs to take the next shot.  Left detailed message on patient's personal voicemail asking for details as to what side effects she had following her Humira injection and the time frame she had these.  Informed patient we need these specifications in order for the doctor to evaluate appropriately.  Informed patient she could leave information on nurse line message again or use her mychart message to communicate with [place].

## 2020-04-07 LAB — ANTIMULLERIAN HORMONE (AMH): AMH (MULLERIAN): 7 ng/mL

## 2020-04-07 LAB — DHEA-SULFATE: DHEA SULFATE: 272 ug/dL

## 2020-04-07 NOTE — Unmapped (Signed)
Call from patient on nurse line.  States she took Covid test and it was negative.  Wants to know next step.

## 2020-04-07 NOTE — Unmapped (Signed)
-----   Message from Adonis Housekeeper sent at 04/06/2020  4:27 PM EST -----  Regarding: hi BEST NURSE EVER!!!  Hi Rosea Dory,    Can you call patient back?    Thanks!

## 2020-04-07 NOTE — Unmapped (Signed)
Returned call to patient again. Able to speak with patient this time.  Per patient, when she took the loading dose of two injections at one time of the Humira states she just felt nauseous and fatigued.  Took the next injection this past Saturday of just one shot and immediately started feeling itchy at site on abdomen.  Feverish at site with red ,orange sized raised spots appearing.  Felt like throat was closing up and was having difficulty breathing.  Also had the worse headache she has ever had.  Began taking Benadryl every 4 hours while at home and it started to help her breathing.  The site is less red now and the puffiness has gone down.  Patient declines having difficulty breathing or throat closing up at this time.  States it is sore in her throat where she felt this before.  Informed patient that she needs to promise me that if she feels this way again, she needs to dial 911 and seek emergency care.  Patient voiced understanding.  I informed patient I will alert her provider but to not take anymore Humira until she hears back.  Patient understood and very appreciative of call back.

## 2020-04-07 NOTE — Unmapped (Signed)
S/w patient   Initially felt fatigued   Last Saturday took second dose, and had itching around injection site at 4pm. When she woke up the next day, had an rash at site of prior loading dose injections.  Next monring, woke up and had a headache. Felt throat closing up and hard to swallow and breathe. Still sore now and headache. But otherwise feeling at baseline.     Advised to take a covid test given that an anaphylactic reaction/type I hypersensitivity will usually occur 15-30 min after injection rather than next day and I don't want to be incorrectly associating her new symptoms with humira. She has an at home test that she will take and update [place]. Pending results, will likely change to cosentyx for off label use for HS.     She is agreeable to plan.

## 2020-04-07 NOTE — Unmapped (Signed)
Tried to call patient back x 2 but went directly to the personal voicemail.  Left message that I had sent message to Dr. Elder Cyphers and Dr. Marvis Moeller and they will get back with her or let me know what to tell her.  To try not to be anxious.

## 2020-04-07 NOTE — Unmapped (Signed)
Called patient and left voicemail to call back or to send message on MyChart.

## 2020-04-07 NOTE — Unmapped (Signed)
Called and spoke with patient.  Informed her , per Dr. Elder Cyphers, she needs to stop Humira.  Cosentyx has been ordered and sent to a specialty pharmacy as discussed and it will probably take a few weeks to get it approved.  Patient voiced understanding and agreeable to plan.

## 2020-04-10 ENCOUNTER — Encounter
Admit: 2020-04-10 | Discharge: 2020-04-11 | Disposition: A | Discharge status: Home / Self Care | Payer: PRIVATE HEALTH INSURANCE | Attending: Emergency Medicine

## 2020-04-10 DIAGNOSIS — R221 Localized swelling, mass and lump, neck: Principal | ICD-10-CM

## 2020-04-10 DIAGNOSIS — R198 Other specified symptoms and signs involving the digestive system and abdomen: Principal | ICD-10-CM

## 2020-04-10 LAB — CBC W/ AUTO DIFF
BASOPHILS ABSOLUTE COUNT: 0.1 10*9/L (ref 0.0–0.1)
BASOPHILS RELATIVE PERCENT: 0.8 %
EOSINOPHILS ABSOLUTE COUNT: 0.1 10*9/L (ref 0.0–0.5)
EOSINOPHILS RELATIVE PERCENT: 1 %
HEMATOCRIT: 36 % (ref 34.0–44.0)
HEMOGLOBIN: 12 g/dL (ref 11.3–14.9)
LYMPHOCYTES ABSOLUTE COUNT: 3.3 10*9/L (ref 1.1–3.6)
LYMPHOCYTES RELATIVE PERCENT: 38.7 %
MEAN CORPUSCULAR HEMOGLOBIN CONC: 33.4 g/dL (ref 32.0–36.0)
MEAN CORPUSCULAR HEMOGLOBIN: 27.2 pg (ref 25.9–32.4)
MEAN CORPUSCULAR VOLUME: 81.3 fL (ref 77.6–95.7)
MEAN PLATELET VOLUME: 9.3 fL (ref 6.8–10.7)
MONOCYTES ABSOLUTE COUNT: 0.5 10*9/L (ref 0.3–0.8)
MONOCYTES RELATIVE PERCENT: 5.7 %
NEUTROPHILS ABSOLUTE COUNT: 4.6 10*9/L (ref 1.8–7.8)
NEUTROPHILS RELATIVE PERCENT: 53.8 %
NUCLEATED RED BLOOD CELLS: 0 /100{WBCs} (ref <=4)
PLATELET COUNT: 275 10*9/L (ref 150–450)
RED BLOOD CELL COUNT: 4.43 10*12/L (ref 3.95–5.13)
RED CELL DISTRIBUTION WIDTH: 13.9 % (ref 12.2–15.2)
WBC ADJUSTED: 8.6 10*9/L (ref 3.6–11.2)

## 2020-04-10 LAB — BASIC METABOLIC PANEL
ANION GAP: 9 mmol/L (ref 5–14)
BLOOD UREA NITROGEN: 7 mg/dL — ABNORMAL LOW (ref 9–23)
BUN / CREAT RATIO: 14
CALCIUM: 9.5 mg/dL (ref 8.7–10.4)
CHLORIDE: 108 mmol/L — ABNORMAL HIGH (ref 98–107)
CO2: 23.3 mmol/L (ref 20.0–31.0)
CREATININE: 0.5 mg/dL — ABNORMAL LOW
EGFR CKD-EPI AA FEMALE: >90 mL/min/{1.73_m2} (ref >=60)
EGFR CKD-EPI NON-AA FEMALE: >90 mL/min/{1.73_m2} (ref >=60)
GLUCOSE RANDOM: 111 mg/dL (ref 70–179)
POTASSIUM: 3.7 mmol/L (ref 3.4–4.8)
SODIUM: 140 mmol/L (ref 135–145)

## 2020-04-10 LAB — HCG QUANTITATIVE, BLOOD: GONADOTROPIN, CHORIONIC (HCG) QUANT: <2.6 m[IU]/mL

## 2020-04-11 LAB — TESTOSTERONE, FREE, TOTAL
TESTOSTERONE (MAYO): 38 ng/dL
TESTOSTERONE FREE: 1.03 ng/dL

## 2020-04-11 LAB — 17-HYDROXYPROGESTERONE: 17-HYDROXYPROGESTERONE: <40 ng/dL

## 2020-04-11 MED ADMIN — dexAMETHasone (DECADRON) tablet 10 mg: 10 mg | ORAL | @ 02:00:00 | Stop: 2020-04-10

## 2020-04-11 MED ADMIN — ketorolac (TORADOL) injection 15 mg: 15 mg | INTRAVENOUS | @ 03:00:00 | Stop: 2020-04-10

## 2020-04-11 MED ADMIN — iohexoL (OMNIPAQUE) 350 mg iodine/mL solution 75 mL: 75 mL | INTRAVENOUS | Stop: 2020-04-10

## 2020-04-11 NOTE — Unmapped (Signed)
Patient presents here with c/o swelling to the rt side of the face since 1 week associated with difficulty swallowing and shortness of breath. States that  I feel like my throat is closing  . Denies any contact with allergens

## 2020-04-11 NOTE — Unmapped (Signed)
[facility]  Emergency Department Provider Note    ED Clinical Impression     Final diagnoses:   Neck swelling (Primary)   Globus sensation       Initial Impression, ED Course, Assessment and Plan     Impression: 25 year old female with a history of HTN, PCOS, and HS presenting for throat pain and odynophagia.  Progressive over the past 7 days, no fever or chills.  States voice sounds different.  Managing secretions well.    On arrival, heart rate 99, pressure 176/86, respiratory rate 18, satting 98% with temperature of 97.  She is well-appearing, sitting up in the stretcher pleasantly conversant.  Exam notable for tenderness under the right submental and submandibular region without appreciable fluctuance, erythema, or warmth.  Sublingual space is soft.  Uvula midline, no tonsillar exudates or pharyngeal erythema, full range of motion in the neck without discomfort.  No appreciable stones over the parotid or submandibular gland.  No cervical adenopathy.    Presentation may be due to submandibular abscess or other deep space infection versus sialoadenitis.  No evidence of Ludewig's, RPA, PTA, or epiglottitis.  Plan for CT soft tissue neck.    8:22 PM  CBC has no leukocytosis, anemia, thrombocytopenia.  BMP has no significant elect light derangement, normal bicarb, normal renal function, normal glucose.  hCG is negative.    CT scan negative for any evidence of abscess or sialoadenitis.    Given her globus sensation and some slight inflammation on the CT, we will give 10 mg dexamethasone here as well as 15 mg Toradol, I recommended she use Motrin for discomfort.  I reviewed return precautions with her.  I also advised her to follow-up with her PCP in 3 to 5 days for repeat exam.  She knowledged and is in agreement with discharge.      Additional Medical Decision Making     I have reviewed the vital signs and the nursing notes. Labs and radiology results that were available during my care of the patient were independently reviewed by me and considered in my medical decision making.     I staffed the case with the ED attending, Dr. Rigoberto Noel.       I independently visualized the radiology images.   I reviewed the patient's prior medical records.        Portions of this record have been created using Scientist, clinical (histocompatibility and immunogenetics). Dictation errors have been sought, but may not have been identified and corrected.  ____________________________________________       History     Chief Complaint  Shortness of Breath      HPI   Danielle Nunez is a 25 y.o. female with PMH of HS, HTN and PCOS presenting for evaluation of throat pain. Patient reports one week of worsening throat swelling with associated difficulty breathing and odynophagia, stating she feels like she is swallowing a rock. She believes her lymph nodes are swollen and her voice sounds altered from baseline. No history of tobacco use. Patient denies fever, chills, cough, or rhinorrhea.       Past Medical History:   Diagnosis Date   ??? Hidradenitis suppurativa    ??? Hypertension    ??? PCOS (polycystic ovarian syndrome)        There is no problem list on file for this patient.      No past surgical history on file.    No current facility-administered medications for this encounter.    Current Outpatient Medications:   ???  empty container Misc, Use as directed to dispose of Humira pens, Disp: 1 each, Rfl: 3  ???  etonogestreL-ethinyl estradioL (NUVARING) 0.12-0.015 mg/24 hr vaginal ring, Insert 1 each into the vagina every twenty-one (21) days. Insert one (1) ring vaginally and leave in place for three (3) weeks, then remove for one (1) week., Disp: 1 each, Rfl: 11  ???  HUMIRA PEN CITRATE FREE STARTER PACK FOR CROHN'S/UC/HS 3 X 80 MG/0.8 ML, Inject the contents of 2 pens (160 mg) under the skin on day 1, THEN inject 1 pen (80 mg) on day 15. THEN inject 1 pen (40 mg) every week (Patient not taking: Reported on 04/10/2020), Disp: 3 each, Rfl: 0  ???  hydrOXYzine (ATARAX) 25 MG tablet, Take 25 mg by mouth., Disp: , Rfl:   ???  ibuprofen (MOTRIN) 600 MG tablet, Take 600 mg by mouth., Disp: , Rfl:   ???  metFORMIN (GLUCOPHAGE) 500 MG tablet, Take 500 mg by mouth., Disp: , Rfl:   ???  sertraline (ZOLOFT) 25 MG tablet, Take 25 mg by mouth daily., Disp: , Rfl:     Allergies  Amoxicillin, Humira [adalimumab], and Penicillins    History reviewed. No pertinent family history.    Social History  Social History     Tobacco Use   ??? Smoking status: Former Smoker   ??? Smokeless tobacco: Never Used   Substance Use Topics   ??? Alcohol use: Yes     Alcohol/week: 3.0 standard drinks     Types: 3 Cans of beer per week   ??? Drug use: Never       Review of Systems  Constitutional: Negative for fever.  Eyes: Negative for visual changes.  ENT: Positive for sore throat and difficulty swallowing.  CV: Negative for chest pain.  Resp: Positive for shortness of breath. Negative for cough.  GI: Negative for nausea, vomiting, or abdominal pain.  GU: Negative for dysuria or hematuria.  MSK: Negative for back pain.  Derm: Negative for rash.  Neuro: Negative for headaches.        Physical Exam     ED Triage Vitals [04/10/20 1640]   Enc Vitals Group      BP 176/86      Heart Rate 99      SpO2 Pulse       Resp 18      Temp 36.1 ??C (97 ??F)      Temp Source Oral      SpO2 98 %       Constitutional: Appears stated age, sitting up in the stretcher resting comfortably in no acute distress.  HEENT:  Tenderness under right submental and submandibular area without appreciable fluctuance, erhythema, or warmth. Sublingual space is soft. No pain with full ROM of the neck. Normocephalic and atraumatic.Conjunctivae clear. No congestion. Moist mucous membranes. No cervical lymphadenopathy.   Heme/Lymph/Immuno: No petechiae or bruising  CV: RRR, no murmurs. Symmetric pulses in all extremities. No JVD or peripheral edema.  Resp: Clear to auscultation bilaterally. No wheezes or rhonchii.  GI: Soft and non tender, non distended. No rebound, rigidity, or guarding.   GU: There is no CVA tenderness.   MSK: Normal range of motion in all extremities.  Neuro: Normal speech and language. No gross focal neurologic deficits appreciated.  Skin: Warm, dry and intact.  Psychiatric: Mood and affect are normal. Speech and behavior are normal.       EKG     None    Radiology  CT Neck Soft Tissue W Contrast   Final Result   No submandibular abscess or sialolithiasis as clinically questioned.            Procedures     None         Documentation assistance was provided by Winferd Humphrey, Scribe, on April 10, 2020 at 6:04 PM for Barrett Shell, MD.    I reviewed the documentation as performed by the scribe and agree with its contents.             Barrett Shell, MD  Resident  04/10/20 [phone]

## 2020-04-11 NOTE — Unmapped (Signed)
.  Patient rounding complete, call bell in reach, bed locked and in lowest position, patient belongings at bedside and within reach of patient.  Patient updated on plan of care.      Patient changed hospital gown awaiting go to CT scan . Pt husband sitting at the bedside with patient.

## 2020-04-12 DIAGNOSIS — R221 Localized swelling, mass and lump, neck: Secondary | ICD-10-CM | POA: Insufficient documentation

## 2020-04-13 ENCOUNTER — Encounter
Admit: 2020-04-13 | Discharge: 2020-04-14 | Payer: PRIVATE HEALTH INSURANCE | Attending: Dermatology | Primary: Dermatology

## 2020-04-13 DIAGNOSIS — L732 Hidradenitis suppurativa: Principal | ICD-10-CM

## 2020-04-13 DIAGNOSIS — R070 Pain in throat: Principal | ICD-10-CM

## 2020-04-13 MED ORDER — LIDOCAINE HCL 2 % MUCOSAL SOLUTION
INTRAMUSCULAR | 0 refills | 0.00000 days | Status: CP
Start: 2020-04-13 — End: ?

## 2020-04-13 MED ORDER — PREDNISONE 10 MG TABLET
ORAL_TABLET | ORAL | 0 refills | 20.00000 days | Status: CP
Start: 2020-04-13 — End: 2020-05-03

## 2020-04-13 NOTE — Unmapped (Signed)
Called patient back.  Still having swelling of lymph node and throat on right side.  Difficulty breathing from the inflammation.  States went to ED this weekend and they did CT Scan.  The scan showed up the inflammation but no reason for it.  They recommended she see her PCP.  PCP ordered a Ultrasound to be done and to go back to Dermatology since not familiar with Humira reaction and still feels that is what is going on.  Patient denies any fever, but is having bad headaches and dizziness.  Has had a negative Covid test back when Dr. Elder Cyphers ordered one on 04/07/20. Please advise.

## 2020-04-13 NOTE — Unmapped (Signed)
Today at 3 with Dr Elder Cyphers    Thanks!

## 2020-04-13 NOTE — Unmapped (Signed)
Dermatology Note     Assessment and Plan:    Hidradenitis suppurativa  -hurley stage 2, failed humira due to side effects, not at treatment goal   Chronic problem with exacerbation   - predniSONE (DELTASONE) 10 MG tablet; Take 4 tablets (40 mg total) by mouth daily for 5 days, THEN 3 tablets (30 mg total) daily for 5 days, THEN 2 tablets (20 mg total) daily for 5 days, THEN 1 tablet (10 mg total) daily for 5 days.   R/B/A reviewed including but not limited to mood changes.   - reviewed off label use of secukinumab.  R/B/A reviewed including but not limited to risk infection. No FH of IBD. She is amenable to this plan. Rx secukinumab (COSENTYX PEN, 2 PENS,) 150 mg/mL PnIj injection; 300 mg once weekly at weeks 0, 1, 2, 3, and 4  - secukinumab (COSENTYX PEN, 2 PENS,) 150 mg/mL PnIj injection; Inject 2 mL (300 mg total) under the skin every thirty (30) days.    Throat pain - reviewed unlikely to be directly related to her humira injections. Reviewed that humira does increase risk of infections including URI. Recommend supportive care. Workup in ED with CT did not show anaphylaxis or abscess.   - RX lidocaine 2% viscous (LIDOCAINE) 2 % Soln; 15 mL Swish/gargled and spit every 3 hours as needed for throat irritation. DO NOT USE MORE FREQUENTLY THEN EVERY 3 HOURS, 8 doses maximum in 24 hour period.  R/B/A reviewed including but not limited to risk systemic toxicity.       The patient was advised to call for an appointment should any new, changing, or symptomatic lesions develop.     This visit was billed based on medical decision making as detailed below:   Problem: 1 or more chronic illnesses with exacerbation, progression, or side effects of treatment (moderate)   Data: (Straightforward) Minimal or none   Management: Prescription drug management (moderate)       RTC: Return in about 8 weeks (around 06/08/2020) for follow up HS 15 min . or sooner as needed _________________________________________________________________      Chief Complaint     Chief Complaint   Patient presents with   ??? Follow-up     on swelling on throat and lymp nodes from allergict reaction to humira injections       HPI     Danielle Nunez is a 25 y.o. female who presents as a returning patient (last seen 03/02/2020) to [facility] for follow up of HS and reaction .     First injection - no issues   Second injection - redness where she injected it. Next morning woke up and felt short of breath. Noticed a rash around injection site but nowhere else.     Did a covid test.     Continues to have throat feeling sore and tightness in throat. Feels headache on   Knot developed behind the ear after second shot.   Dexamethasone in the ED made her feel better. Initially really helped.   Tried tylenol and motrin (didn't help) and ibuprofen. Sometimes the excedrin for migraines is helpful. Her usual headaches affect the back of her head rather than the front.    Spoke with her primary yesterday who referred her here.      No fevers, chills, cold symptoms, sinus pressure, not coughing. Throat has been very very dry. She does take allegra for allergies.      Has had some boils come up on  back of legs since stopping Humira.     Reviewed ED notes and imaging 04/10/20 -   ??IMPRESSION:  No submandibular abscess or sialolithiasis as clinically questioned.    The patient denies any other new or changing lesions or areas of concern.     Pertinent Past Medical History     HS hurley 2     Past Medical History, Family History, Social History, Medication List, Allergies, and Problem List were reviewed in the rooming section of Epic.     ROS: Other than symptoms mentioned in the HPI, no fevers, chills, or other skin complaints    Physical Examination     GENERAL: Well-appearing female in no acute distress, resting comfortably.  NEURO: Alert and oriented, answers questions appropriately  SKIN (Focal Skin Exam): Per patient request, examination of mouth, lower legs was performed  Inflamed sinus tracts     All areas not commented on are within normal limits or unremarkable  - female chaperone present      (Approved Template 10/19/2019)

## 2020-04-13 NOTE — Unmapped (Signed)
We will send for cosentyx   Please start the prednisone taper   You can use the lidocaine swish and spit as directed. Do not use more frequently than directed.

## 2020-04-13 NOTE — Unmapped (Signed)
-----   Message from Adonis Housekeeper sent at 04/13/2020 10:06 AM EST -----  Hi,    Can you call patient back?    Thanks!

## 2020-04-13 NOTE — Unmapped (Signed)
Patient called nurse line and left message stating her symptoms are worse , had to go to ED and followed up with PCP.  PCP advised she f/u with Dermatology because not familiar with reaction to Humira.  Tried to call patient back to obtain more information but left a detailed message asking her for a f/u call or mychart message.

## 2020-04-20 MED ORDER — COSENTYX PEN 300 MG/2 PENS (150 MG/ML) SUBCUTANEOUS
SUBCUTANEOUS | 11 refills | 30.00000 days | Status: CP
Start: 2020-04-20 — End: ?

## 2020-04-25 DIAGNOSIS — L732 Hidradenitis suppurativa: Principal | ICD-10-CM

## 2020-04-25 MED ORDER — COSENTYX PEN 300 MG/2 PENS (150 MG/ML) SUBCUTANEOUS
SUBCUTANEOUS | 0 refills | 0.00000 days | Status: CP
Start: 2020-04-25 — End: ?

## 2020-04-28 DIAGNOSIS — R1011 Right upper quadrant pain: Secondary | ICD-10-CM | POA: Insufficient documentation

## 2020-04-28 DIAGNOSIS — R030 Elevated blood-pressure reading, without diagnosis of hypertension: Secondary | ICD-10-CM | POA: Insufficient documentation

## 2020-05-06 NOTE — Unmapped (Signed)
Patient called stating having HS flare and found out Cosentyx was denied.  Stopped Doxycycline a while back and wants to discuss treatment plan as to whether needs to return to taking the Doxy.

## 2020-05-09 NOTE — Unmapped (Signed)
Thank you! Called and left a VM - plan to restart doxycycline. We are appealing the cosentyx denial. Will send her a mychart too

## 2020-05-20 LAB — ESTRADIOL(ESTROGEN) LEVEL

## 2020-05-27 ENCOUNTER — Emergency Department
Admit: 2020-05-27 | Discharge: 2020-05-27 | Disposition: A | Discharge status: Home / Self Care | Payer: PRIVATE HEALTH INSURANCE | Attending: Emergency Medicine

## 2020-05-27 ENCOUNTER — Encounter
Admit: 2020-05-27 | Discharge: 2020-05-27 | Disposition: A | Discharge status: Home / Self Care | Payer: PRIVATE HEALTH INSURANCE | Attending: Emergency Medicine

## 2020-05-27 ENCOUNTER — Ambulatory Visit
Admit: 2020-05-27 | Discharge: 2020-05-27 | Disposition: A | Discharge status: Home / Self Care | Payer: PRIVATE HEALTH INSURANCE | Attending: Emergency Medicine

## 2020-05-27 LAB — URINALYSIS WITH CULTURE REFLEX
BACTERIA: NONE SEEN /HPF
BILIRUBIN UA: NEGATIVE
BLOOD UA: NEGATIVE
GLUCOSE UA: NEGATIVE
KETONES UA: NEGATIVE
LEUKOCYTE ESTERASE UA: NEGATIVE
NITRITE UA: NEGATIVE
PH UA: 6.5 (ref 5.0–9.0)
PROTEIN UA: NEGATIVE
RBC UA: <1 /HPF (ref <4)
SPECIFIC GRAVITY UA: 1.015 (ref 1.005–1.040)
SQUAMOUS EPITHELIAL: 1 /HPF (ref 0–5)
UROBILINOGEN UA: 0.2
WBC UA: <1 /HPF (ref 0–5)

## 2020-05-27 LAB — CBC W/ AUTO DIFF
BASOPHILS ABSOLUTE COUNT: 0.1 10*9/L (ref 0.0–0.1)
BASOPHILS RELATIVE PERCENT: 0.6 %
EOSINOPHILS ABSOLUTE COUNT: 0.2 10*9/L (ref 0.0–0.5)
EOSINOPHILS RELATIVE PERCENT: 1.8 %
HEMATOCRIT: 35.7 % (ref 34.0–44.0)
HEMOGLOBIN: 12 g/dL (ref 11.3–14.9)
LYMPHOCYTES ABSOLUTE COUNT: 4.1 10*9/L — ABNORMAL HIGH (ref 1.1–3.6)
LYMPHOCYTES RELATIVE PERCENT: 39.2 %
MEAN CORPUSCULAR HEMOGLOBIN CONC: 33.7 g/dL (ref 32.0–36.0)
MEAN CORPUSCULAR HEMOGLOBIN: 27 pg (ref 25.9–32.4)
MEAN CORPUSCULAR VOLUME: 80.2 fL (ref 77.6–95.7)
MEAN PLATELET VOLUME: 9.4 fL (ref 6.8–10.7)
MONOCYTES ABSOLUTE COUNT: 0.6 10*9/L (ref 0.3–0.8)
MONOCYTES RELATIVE PERCENT: 5.8 %
NEUTROPHILS ABSOLUTE COUNT: 5.5 10*9/L (ref 1.8–7.8)
NEUTROPHILS RELATIVE PERCENT: 52.6 %
NUCLEATED RED BLOOD CELLS: 0 /100{WBCs} (ref <=4)
PLATELET COUNT: 278 10*9/L (ref 150–450)
RED BLOOD CELL COUNT: 4.45 10*12/L (ref 3.95–5.13)
RED CELL DISTRIBUTION WIDTH: 14.1 % (ref 12.2–15.2)
WBC ADJUSTED: 10.5 10*9/L (ref 3.6–11.2)

## 2020-05-27 LAB — COMPREHENSIVE METABOLIC PANEL
ALBUMIN: 3.9 g/dL (ref 3.4–5.0)
ALKALINE PHOSPHATASE: 78 U/L (ref 46–116)
ALT (SGPT): 40 U/L (ref 10–49)
ANION GAP: 6 mmol/L (ref 5–14)
AST (SGOT): 23 U/L (ref <=34)
BILIRUBIN TOTAL: 0.2 mg/dL — ABNORMAL LOW (ref 0.3–1.2)
BLOOD UREA NITROGEN: 13 mg/dL (ref 9–23)
BUN / CREAT RATIO: 21
CALCIUM: 9.4 mg/dL (ref 8.7–10.4)
CHLORIDE: 109 mmol/L — ABNORMAL HIGH (ref 98–107)
CO2: 25.6 mmol/L (ref 20.0–31.0)
CREATININE: 0.61 mg/dL
EGFR CKD-EPI AA FEMALE: >90 mL/min/{1.73_m2} (ref >=60)
EGFR CKD-EPI NON-AA FEMALE: >90 mL/min/{1.73_m2} (ref >=60)
GLUCOSE RANDOM: 116 mg/dL (ref 70–179)
POTASSIUM: 4 mmol/L (ref 3.4–4.8)
PROTEIN TOTAL: 7.2 g/dL (ref 5.7–8.2)
SODIUM: 141 mmol/L (ref 135–145)

## 2020-05-27 LAB — PREGNANCY, URINE: PREGNANCY TEST URINE: NEGATIVE

## 2020-05-27 LAB — LIPASE: LIPASE: 32 U/L (ref 12–53)

## 2020-05-27 MED ORDER — MELOXICAM 7.5 MG TABLET
ORAL_TABLET | Freq: Every day | ORAL | 0 refills | 7 days | Status: CP
Start: 2020-05-27 — End: 2020-06-03

## 2020-05-27 MED ADMIN — ketorolac (TORADOL) injection 30 mg: 30 mg | INTRAVENOUS | @ 08:00:00 | Stop: 2020-05-27

## 2020-05-27 MED ADMIN — iohexoL (OMNIPAQUE) 350 mg iodine/mL solution 100 mL: 100 mL | INTRAVENOUS | @ 06:00:00 | Stop: 2020-05-27

## 2020-05-27 NOTE — Unmapped (Signed)
Patient reports RLQ abdominal pain radiating to right flank starting at 2100. Denies urinary symptoms. Endorses nausea without vomiting.   Patient seeming uncomfortable in triage.   Denies fevers/chills.

## 2020-05-27 NOTE — Unmapped (Signed)
[facility]  Emergency Department Provider Note    Initial Impression, ED Course, Assessment and Plan     Impression: 25 y.o. female  with a history of hidradenitis suppurativa hypertension PCOS obesity presents to the emergency department for evaluation of abdominal pain and back pain that began gradually this evening.  The patient endorses a constant sharp pain in her right flank and upper abdomen.  She has not had emesis diarrhea.  Last bowel movement was earlier today.    Patient appears uncomfortable but is nontoxic-appearing.  Vitals within normal limits.  Normal cardiopulmonary exam.  Patient notably obese.  She does have right-sided CVA tenderness, right lower quadrant tenderness with guarding.  Patient also has right upper quadrant tenderness with guarding.  Left abdomen nontender to palpation.    BP 149/88  - Pulse 86  - Temp 36.8 ??C (98.3 ??F) (Oral)  - Resp 18  - Ht 167.6 cm (5' 6)  - Wt (!) 136.1 kg (300 lb)  - SpO2 98%  - BMI 48.42 kg/m??     Assessment/Plan: Patient presentation concerning for acute appendicitis, nephrolithiasis, cholecystitis ovarian cyst rupture, ovarian torsion.  Pregnancy test, urinalysis negative in triage.  Labs overall reassuring as well.  CT abdomen pelvis ordered for initial evaluation.  If negative right upper quadrant ultrasound in pelvic ultrasound can also be considered.        MDM/Disposition: Results of CT pending.  Disposition, further work-up pending CT    Remainder of workup pending. Patient care signed out to oncoming physician.      _____________________________________________________    The case was discussed with the attending physician who is in agreement with the above assessment and plan.    ED Clinical Impression     Final diagnoses:   Right lower quadrant abdominal pain (Primary)   Right flank pain       Additional Medical Decision Making     Time seen: May 27, 2020 1:12 AM    I have reviewed the vital signs and the nursing notes. Labs and radiology results that were available during my care of the patient were independently reviewed by me and considered in my medical decision making.     I independently visualized the EKG tracing if performed  I independently visualized the radiology images if performed  I reviewed the patient's prior medical records if available.  Additional history obtained from family if available    Please note- This chart has been created using [employer]. Chart creation errors have been sought, but may not always be located and such creation errors, especially pronoun confusion, do NOT reflect on the standard of medical care.      History     Chief Complaint  Flank Pain      History provided by patient    HPI   Danielle Nunez is a 25 y.o. female with a history of hidradenitis suppurativa hypertension PCOS obesity presents to the emergency department for evaluation of abdominal pain and back pain that began gradually this evening.  The patient endorses a constant sharp pain in her right flank and upper abdomen.  She has not had emesis diarrhea.  Last bowel movement was earlier today.  Patient denies vaginal bleeding or discharge.  Denies dysuria or increased frequency of urination.  Denies chest pain, difficulty breathing.  No surgical history.      ROS -  10 point review of systems was performed and is negative unless otherwise noted in HPI  CONST: Does not endorse fever/chills  NEURO: Does not endorse headache  NECK: Does not endorse neck stiffness  ENT: Does not endorse congestion or sore throat  CV: Does not endorse chest pain, syncope  RESP: Does not endorse cough or shortness of breath.  GI: Does not endorse nausea, vomiting, or abdominal pain  HEME: Does not endorse rectal bleeding  GU: Does not endorse dysuria  SKIN: Does not endorse skin lesions      PAST MEDICAL HISTORY/PAST SURGICAL HISTORY:   Past Medical History:   Diagnosis Date   ??? Hidradenitis suppurativa    ??? Hypertension    ??? PCOS (polycystic ovarian syndrome) There is no problem list on file for this patient.      No past surgical history on file.    MEDICATIONS:   No current facility-administered medications for this encounter.    Current Outpatient Medications:   ???  empty container Misc, Use as directed to dispose of Humira pens, Disp: 1 each, Rfl: 3  ???  etonogestreL-ethinyl estradioL (NUVARING) 0.12-0.015 mg/24 hr vaginal ring, Insert 1 each into the vagina every twenty-one (21) days. Insert one (1) ring vaginally and leave in place for three (3) weeks, then remove for one (1) week., Disp: 1 each, Rfl: 11  ???  hydrOXYzine (ATARAX) 25 MG tablet, Take 25 mg by mouth., Disp: , Rfl:   ???  ibuprofen (MOTRIN) 600 MG tablet, Take 600 mg by mouth., Disp: , Rfl:   ???  lidocaine 2% viscous (LIDOCAINE) 2 % Soln, 15 mL Swish/gargled and spit every 3 hours as needed for throat irritation. DO NOT USE MORE FREQUENTLY THEN EVERY 3 HOURS, 8 doses maximum in 24 hour period., Disp: 100 mL, Rfl: 0  ???  metFORMIN (GLUCOPHAGE) 500 MG tablet, Take 500 mg by mouth., Disp: , Rfl:   ???  secukinumab (COSENTYX PEN, 2 PENS,) 150 mg/mL PnIj injection, Inject the contents of 2 pens (300 mg) under the skin once weekly at weeks 0, 1, 2, 3, and 4 for loading doses., Disp: 10 mL, Rfl: 0  ???  secukinumab (COSENTYX PEN, 2 PENS,) 150 mg/mL PnIj injection, Inject the contents of 2 pens (300 mg total) under the skin every thirty (30) days., Disp: 2 mL, Rfl: 11  ???  sertraline (ZOLOFT) 25 MG tablet, Take 25 mg by mouth daily., Disp: , Rfl:     ALLERGIES:   Amoxicillin, Humira [adalimumab], and Penicillins    SOCIAL HISTORY:   Social History     Tobacco Use   ??? Smoking status: Former Smoker   ??? Smokeless tobacco: Never Used   Substance Use Topics   ??? Alcohol use: Yes     Alcohol/week: 3.0 standard drinks     Types: 3 Cans of beer per week       FAMILY HISTORY:  No family history on file.       Physical Exam     ED Triage Vitals [05/26/20 2342]   Enc Vitals Group      BP 149/88      Heart Rate 86      SpO2 Pulse 86      Resp 18      Temp 36.8 ??C (98.3 ??F)      Temp Source Oral      SpO2 98 %      Weight (!) 136.1 kg (300 lb)      Height 1.676 m (5' 6)      Head Circumference  Peak Flow       Pain Score       Pain Loc       Pain Edu?       Excl. in GC?        EXAM     CONST: Uncomfortable appearing. Non toxic  HEENT: Normocephalic and atraumatic. No congestion, epistaxis. Moist mucous membranes.   CV: RRR, normal S1/S2, no murmurs. Intact peripheral pulse. No appreciated peripheral edema.  RESP: Normal respiratory effort. Lungs clear to auscultation bilaterally without wheezes or crackles.  ABD: Patient notably obese.  She does have right-sided CVA tenderness, right lower quadrant tenderness with guarding.  Patient also has right upper quadrant tenderness with guarding.  Left abdomen nontender to palpation.  MSK: Normal range of motion in all extremities. Able to ambulate.   SKIN: Warm, dry and intact without rashes.  HEME: No petechiae or bruising  PSYCH: Mood and affect are normal. Speech and behavior are normal.  NEURO: Normal speech and language. No appreciated facial droop, dysarthria, or aphasia. Moves all extremities spontaneously.      Radiology     CT Abdomen Pelvis W Contrast    (Results Pending)     No results found.     LABORATORY DATA:     Results for orders placed or performed during the hospital encounter of 05/27/20   Comprehensive Metabolic Panel   Result Value Ref Range    Sodium 141 135 - 145 mmol/L    Potassium 4.0 3.4 - 4.8 mmol/L    Chloride 109 (H) 98 - 107 mmol/L    Anion Gap 6 5 - 14 mmol/L    CO2 25.6 20.0 - 31.0 mmol/L    BUN 13 9 - 23 mg/dL    Creatinine 1.61 0.96 - 0.80 mg/dL    BUN/Creatinine Ratio 21     EGFR CKD-EPI Non-African American, Female >90 >=60 mL/min/1.71m2    EGFR CKD-EPI African American, Female >90 >=60 mL/min/1.65m2    Glucose 116 70 - 179 mg/dL    Calcium 9.4 8.7 - 04.5 mg/dL    Albumin 3.9 3.4 - 5.0 g/dL    Total Protein 7.2 5.7 - 8.2 g/dL    Total Bilirubin 0.2 (L) 0.3 - 1.2 mg/dL    AST 23 <=40 U/L    ALT 40 10 - 49 U/L    Alkaline Phosphatase 78 46 - 116 U/L   Urinalysis with Culture Reflex    Specimen: Clean Catch; Urine   Result Value Ref Range    Color, UA Yellow     Clarity, UA Clear     Specific Gravity, UA 1.015 1.005 - 1.040    pH, UA 6.5 5.0 - 9.0    Leukocyte Esterase, UA Negative Negative    Nitrite, UA Negative Negative    Protein, UA Negative Negative    Glucose, UA Negative Negative    Ketones, UA Negative Negative    Urobilinogen, UA 0.2 mg/dL     Bilirubin, UA Negative Negative    Blood, UA Negative Negative    RBC, UA <1 <4 /HPF    WBC, UA <1 0 - 5 /HPF    Squam Epithel, UA 1 0 - 5 /HPF    Bacteria, UA None Seen None Seen /HPF   Pregnancy, urine   Result Value Ref Range    Pregnancy Test, Urine Negative Negative   Lipase   Result Value Ref Range    Lipase 32 12 - 53 U/L   CBC w/ Differential  Result Value Ref Range    WBC 10.5 3.6 - 11.2 10*9/L    RBC 4.45 3.95 - 5.13 10*12/L    HGB 12.0 11.3 - 14.9 g/dL    HCT 16.1 09.6 - 04.5 %    MCV 80.2 77.6 - 95.7 fL    MCH 27.0 25.9 - 32.4 pg    MCHC 33.7 32.0 - 36.0 g/dL    RDW 40.9 81.1 - 91.4 %    MPV 9.4 6.8 - 10.7 fL    Platelet 278 150 - 450 10*9/L    nRBC 0 <=4 /100 WBCs    Neutrophils % 52.6 %    Lymphocytes % 39.2 %    Monocytes % 5.8 %    Eosinophils % 1.8 %    Basophils % 0.6 %    Absolute Neutrophils 5.5 1.8 - 7.8 10*9/L    Absolute Lymphocytes 4.1 (H) 1.1 - 3.6 10*9/L    Absolute Monocytes 0.6 0.3 - 0.8 10*9/L    Absolute Eosinophils 0.2 0.0 - 0.5 10*9/L    Absolute Basophils 0.1 0.0 - 0.1 10*9/L          Judie Petit, MD  Resident  05/27/20 [phone]

## 2020-05-31 NOTE — Unmapped (Signed)
-----   Message from Jobe Gibbon, CMA sent at 05/31/2020 11:31 AM EDT -----  Regarding: reschedule  Good morning, pt states she was unaware of appt and wishes to reschedule.

## 2020-05-31 NOTE — Unmapped (Signed)
Appt has been cancelled. Unable to reach pt. Number listed says its not in service.

## 2020-06-02 ENCOUNTER — Ambulatory Visit
Admission: RE | Admit: 2020-06-02 | Discharge: 2020-06-02 | Disposition: A | Discharge status: Home / Self Care | Payer: BC Managed Care – PPO | Source: Ambulatory Visit

## 2020-06-02 ENCOUNTER — Other Ambulatory Visit: Payer: Self-pay

## 2020-06-02 VITALS — BP 137/75 | HR 92 | Temp 98.3°F | Resp 18

## 2020-06-02 DIAGNOSIS — H66001 Acute suppurative otitis media without spontaneous rupture of ear drum, right ear: Secondary | ICD-10-CM | POA: Diagnosis not present

## 2020-06-02 DIAGNOSIS — R0789 Other chest pain: Secondary | ICD-10-CM

## 2020-06-02 MED ORDER — LEVOFLOXACIN 500 MG PO TABS: 500.0000 mg | ORAL_TABLET | Freq: Every day | ORAL | 0 refills | Status: AC

## 2020-06-02 MED ORDER — IBUPROFEN 600 MG PO TABS: 600.0000 mg | ORAL_TABLET | Freq: Four times a day (QID) | ORAL | 0 refills | Status: DC | PRN

## 2020-06-02 MED ORDER — METHOCARBAMOL 500 MG PO TABS: 500.0000 mg | ORAL_TABLET | Freq: Two times a day (BID) | ORAL | 0 refills | Status: DC

## 2020-06-02 NOTE — ED Triage Notes (Signed)
Pt is present today with left side chest pain that started today around 4:30. Pt states that she also feels a shooting pain down the left side of her neck down her left arm. Pt states that her right ear pain started a couple days ago along with ringing her ear.

## 2020-06-02 NOTE — Discharge Instructions (Addendum)
Take the ibuprofen 600 mg with the methocarbamol 1000 mg on an every 6 hour schedule for the next 48 hours to decrease inflammation and muscle spasm causing your chest wall pain.  Apply moist heat to your chest wall and your upper back for 20 minutes at a time 2-3 times a day to help improve blood flow which will decrease muscle spasm and aid in pain relief.  Take the Levaquin 500 mg once daily for 10 days for treatment of your ear infection.  Return for reevaluation for any new or worsening symptoms.

## 2020-06-02 NOTE — ED Provider Notes (Signed)
[facility]    CSN: [id number] Arrival date & time: 06/02/20  1839      History   Chief Complaint Chief Complaint  Patient presents with  . Otalgia  . Chest Pain    HPI Abigail Hale is a 25 y.o. female.   HPI   25 year old female here for evaluation of left-sided chest pain and right ear pain.  Patient reports she has been dealing with right ear pain for the past month and then she developed her chest pain at about 4:00 this afternoon.  She has some associated shortness of breath and sweating as well as dizziness.  She denies nausea, fainting, or palpitations.  With her right ear pain she has had some ringing and decreased hearing but she denies any discharge.  She cannot think of any factors that exacerbate her chest pain or that improve her symptoms.  She denies any heavy lifting or trauma.  Past Medical History:  Diagnosis Date  . Depression   . Hypertension   . Insomnia   . PCOS (polycystic ovarian syndrome)     There are no problems to display for this patient.   Past Surgical History:  Procedure Laterality Date  . TONSILLECTOMY      OB History   No obstetric history on file.      Home Medications    Prior to Admission medications   Medication Sig Start Date End Date Taking? Authorizing Provider  hydrOXYzine (ATARAX/VISTARIL) 25 MG tablet Take by mouth.   Yes [provider]  ibuprofen (ADVIL) 600 MG tablet Take 1 tablet (600 mg total) by mouth every 6 (six) hours as needed. 06/02/20  Yes Becky Augusta, NP  levofloxacin (LEVAQUIN) 500 MG tablet Take 1 tablet (500 mg total) by mouth daily for 10 days. 06/02/20 06/12/20 Yes Becky Augusta, NP  methocarbamol (ROBAXIN) 500 MG tablet Take 1 tablet (500 mg total) by mouth 2 (two) times daily. 06/02/20  Yes Becky Augusta, NP  sertraline (ZOLOFT) 25 MG tablet Take by mouth.   Yes [provider]  QUEtiapine (SEROQUEL) 50 MG tablet Take by mouth.    [provider]  amLODipine  (NORVASC) 2.5 MG tablet Take 2.5 mg by mouth daily.  05/19/19  [provider]  fluticasone (FLONASE) 50 MCG/ACT nasal spray Place 2 sprays into both nostrils daily. 12/23/17 05/19/19  Domenick Gong, MD  metFORMIN (GLUCOPHAGE) 500 MG tablet Take 500 mg by mouth 2 (two) times daily with a meal.  05/19/19  [provider]  spironolactone (ALDACTONE) 100 MG tablet Take 100 mg by mouth daily. 08/09/17 05/19/19  [provider]  venlafaxine XR (EFFEXOR-XR) 150 MG 24 hr capsule Take 150 mg by mouth daily. 08/09/17 05/19/19  [provider]    Family History Family History  Problem Relation Age of Onset  . Healthy Mother   . Diabetes Father     Social History Social History   Tobacco Use  . Smoking status: Never Smoker  . Smokeless tobacco: Never Used  Vaping Use  . Vaping Use: Never used  Substance Use Topics  . Alcohol use: Yes    Comment: socially  . Drug use: Never     Allergies   Amoxicillin and Penicillins   Review of Systems Review of Systems  Constitutional: Positive for diaphoresis. Negative for activity change, appetite change and fever.  HENT: Positive for ear pain, hearing loss and tinnitus. Negative for congestion and ear discharge.   Respiratory: Negative for cough, shortness of breath  and wheezing.   Cardiovascular: Positive for chest pain. Negative for palpitations.  Gastrointestinal: Negative for nausea.  Skin: Negative for rash.  Neurological: Negative for syncope.  Hematological: Negative.   Psychiatric/Behavioral: Negative.      Physical Exam Triage Vital Signs ED Triage Vitals  Enc Vitals Group     BP 06/02/20 1910 137/75     Pulse Rate 06/02/20 1910 92     Resp 06/02/20 1910 18     Temp 06/02/20 1910 98.3 F (36.8 C)     Temp Source 06/02/20 1910 Oral     SpO2 06/02/20 1910 98 %     Weight --      Height --      Head Circumference --      Peak Flow --      Pain Score 06/02/20 1907 8     Pain Loc --      Pain  Edu? --      Excl. in GC? --    No data found.  Updated Vital Signs BP 137/75 (BP Location: Left Arm)   Pulse 92   Temp 98.3 F (36.8 C) (Oral)   Resp 18   SpO2 98%   Visual Acuity Right Eye Distance:   Left Eye Distance:   Bilateral Distance:    Right Eye Near:   Left Eye Near:    Bilateral Near:     Physical Exam Vitals and nursing note reviewed.  Constitutional:      General: She is not in acute distress.    Appearance: She is well-developed. She is not ill-appearing.  HENT:     Head: Normocephalic and atraumatic.     Right Ear: Ear canal and external ear normal.     Left Ear: Tympanic membrane, ear canal and external ear normal.  Eyes:     Extraocular Movements: Extraocular movements intact.     Pupils: Pupils are equal, round, and reactive to light.  Cardiovascular:     Rate and Rhythm: Normal rate and regular rhythm.     Pulses: Normal pulses.     Heart sounds: Normal heart sounds. No murmur heard. No gallop.   Pulmonary:     Effort: Pulmonary effort is normal.     Breath sounds: No wheezing, rhonchi or rales.  Musculoskeletal:        General: Tenderness present. No deformity.  Skin:    General: Skin is warm and dry.     Capillary Refill: Capillary refill takes less than 2 seconds.     Findings: No bruising or erythema.  Neurological:     General: No focal deficit present.     Mental Status: She is alert.  Psychiatric:        Mood and Affect: Mood normal.        Behavior: Behavior normal.        Thought Content: Thought content normal.        Judgment: Judgment normal.      UC Treatments / Results  Labs (all labs ordered are listed, but only abnormal results are displayed) Labs Reviewed - No data to display  EKG   Radiology No results found.  Procedures Procedures (including critical care time)  Medications Ordered in UC Medications - No data to display  Initial Impression / Assessment and Plan / UC Course  I have reviewed the triage  vital signs and the nursing notes.  Pertinent labs & imaging results that were available during my care of the patient were reviewed by  me and considered in my medical decision making (see chart for details).   Patient is a very pleasant 25 year old female here for evaluation of left-sided chest pain that started 4:00 this afternoon and right ear pain this been going on for the past month.  Her right ear pain has had associated symptoms of ringing and decreased hearing but no discharge.  Chest pain has had some shortness of breath, sweating, dizziness, without nausea, syncope, or palpitations.  Patient cannot think of any provoking or relieving symptoms.  Patient denies any heavy lifting or being involved in accidents.  Physical exam reveals an erythematous right tympanic membrane with frank pus behind the eardrum.  External auditory canal is clear on the right.  Left tympanic membrane is pearly gray with normal light reflex and clear external auditory canal.  Patient's cardiopulmonary exam is benign.  Patient does have tenderness to palpation of the superior aspect of the trapezius as well as the superior aspect of the pectoralis major muscle.  No tenderness with palpation of the bicep, deltoid, or tricep.  EKG collected at triage.  EKG shows normal sinus rhythm without ectopy or ST changes.  Ventricular rate is 90, PR intervals 156 ms, QRS duration 92 ms, QT is 360 ms and QTC is 440 ms.  Patient's physical exam is consistent with a right otitis media.  Patient has an allergy to penicillins and amoxicillin that includes hives.  We will treat patient with Levaquin 500 mg once daily for 10 days.  Patient's physical exam is consistent with musculoskeletal chest wall pain.  We will treat with methocarbamol and ibuprofen as well as moist heat.     Final Clinical Impressions(s) / UC Diagnoses   Final diagnoses:  Non-recurrent acute suppurative otitis media of right ear without spontaneous rupture of  tympanic membrane  Chest wall pain     Discharge Instructions     Take the ibuprofen 600 mg with the methocarbamol 1000 mg on an every 6 hour schedule for the next 48 hours to decrease inflammation and muscle spasm causing your chest wall pain.  Apply moist heat to your chest wall and your upper back for 20 minutes at a time 2-3 times a day to help improve blood flow which will decrease muscle spasm and aid in pain relief.  Take the Levaquin 500 mg once daily for 10 days for treatment of your ear infection.  Return for reevaluation for any new or worsening symptoms.    ED Prescriptions    Medication Sig Dispense Auth. Provider   ibuprofen (ADVIL) 600 MG tablet Take 1 tablet (600 mg total) by mouth every 6 (six) hours as needed. 30 tablet Becky Augusta, NP   methocarbamol (ROBAXIN) 500 MG tablet Take 1 tablet (500 mg total) by mouth 2 (two) times daily. 20 tablet Becky Augusta, NP   levofloxacin (LEVAQUIN) 500 MG tablet Take 1 tablet (500 mg total) by mouth daily for 10 days. 10 tablet Becky Augusta, NP     PDMP not reviewed this encounter.   Becky Augusta, NP 06/02/20 1952

## 2020-06-06 ENCOUNTER — Telehealth: Payer: Self-pay | Admitting: Emergency Medicine

## 2020-06-06 MED ORDER — DOXYCYCLINE HYCLATE 100 MG PO CAPS: 100.0000 mg | ORAL_CAPSULE | Freq: Two times a day (BID) | ORAL | 0 refills | Status: AC

## 2020-06-06 NOTE — Telephone Encounter (Signed)
Patient reports that she developed hives from the Levaquin.  Will stop Levaquin and switch patient to doxycycline twice a day for the next 7 days for treatment of her ear infection.

## 2020-06-07 DIAGNOSIS — G8929 Other chronic pain: Principal | ICD-10-CM

## 2020-06-07 DIAGNOSIS — K760 Fatty (change of) liver, not elsewhere classified: Principal | ICD-10-CM

## 2020-06-07 DIAGNOSIS — R1013 Epigastric pain: Principal | ICD-10-CM

## 2020-06-07 DIAGNOSIS — R1011 Right upper quadrant pain: Principal | ICD-10-CM

## 2020-06-13 DIAGNOSIS — L732 Hidradenitis suppurativa: Principal | ICD-10-CM

## 2020-06-13 MED ORDER — SPIRONOLACTONE 50 MG TABLET
ORAL_TABLET | Freq: Two times a day (BID) | ORAL | 3 refills | 15.00000 days | Status: CP
Start: 2020-06-13 — End: 2021-06-13

## 2020-06-17 ENCOUNTER — Institutional Professional Consult (permissible substitution)
Admit: 2020-06-17 | Discharge: 2020-06-17 | Payer: PRIVATE HEALTH INSURANCE | Attending: Audiologist | Primary: Audiologist

## 2020-06-17 ENCOUNTER — Ambulatory Visit
Admit: 2020-06-17 | Discharge: 2020-06-17 | Payer: PRIVATE HEALTH INSURANCE | Attending: Student in an Organized Health Care Education/Training Program | Primary: Student in an Organized Health Care Education/Training Program

## 2020-06-17 DIAGNOSIS — H6690 Otitis media, unspecified, unspecified ear: Principal | ICD-10-CM

## 2020-06-17 DIAGNOSIS — Z0110 Encounter for examination of ears and hearing without abnormal findings: Principal | ICD-10-CM

## 2020-06-17 NOTE — Unmapped (Signed)
[facility]  Audiology at [facility]    AUDIOLOGIC EVALUATION REPORT     Patient: Danielle Nunez, Danielle Nunez  MRN: [id number]  DOB: 1995/09/07  DATE OF EVALUATION: 06/17/2020    PLEASE SEE AUDIOGRAM INCLUDING FULL REPORT IN MEDIA MANAGER TAB.    HISTORY     Shamina Etheridge is a 25 y.o. female seen by Audiology for a hearing evaluation as part of Dr. Mikel Cella add-on clinic. Patient reports a recent ear infection in the right ear approximately two weeks ago. She reports intermittent otalgia in both ears, constant bilateral tinnitus, and decreased hearing (R>L). Patient reports occasional instances of dizziness, which she described as imbalance. Patient reports a prior history of recurrent ear infections and tympanostomy tube placement in childhood. Patient denies family history of hearing loss, loud noise exposure, vertigo, aural fullness/pressure, and recent otorrhea.    RESULTS     Otoscopy revealed:  Right Ear: clear external auditory canal  Left Ear: clear external auditory canal    Tympanometry using a 226 Hz probe tone was consistent with:  Right Ear: Type Ad tympanogram, consistent with normal middle ear pressure and volume and hypercompliance of middle ear  Left Ear: Type Ad tympanogram, consistent with normal middle ear pressure and volume and hypercompliance of middle ear    Today's behavioral evaluation was completed using conventional audiometry via insert earphones with good reliability.   Right Ear: Hearing within normal limits from [phone] Hz  ??? Speech Recognition Threshold (SRT): 15 dB HL  ??? Word Recognition Testing: 96% at 55 dB HL using recorded NU-6 word list.  Left Ear: Hearing within normal limits from [phone] Hz  ??? Speech Recognition Threshold (SRT): 15 dB HL  ??? Word Recognition Testing: 100% at 55 dB HL using recorded NU-6 word list.    Patient was counseled on today's results and expressed understanding.    RECOMMENDATIONS     ??? ENT - Seeing Dr. Lindi Adie today  ??? Re-evaluate hearing per ENT request, sooner should concerns arise    Alena Bills, Au.D., CCC-A  Pediatric Audiologist  [facility] Audiology at [facility]  Scheduling: [phone]    Provider and student wore appropriate PPE (face mask and eye protection) for the entirety of today's appointment. Kenton Kingfisher, B.A. and Hulan Saas., [facility] Doctoral Students, participated in this patient's visit.  Patient wore face mask(s) during the appointment.    Charges associated with this visit:  HC TYMPANOMETRY  HC PURE TONE AUDIO AIR ONLY  HC SPEECH AUDIOMETRY THRSH & SPEECH RECOG

## 2020-06-18 NOTE — Unmapped (Signed)
Otolaryngology/Head and Neck Surgery  New Patient Clinic Note  06/18/2020 12:37 PM     Assessment and Plan:     Danielle Nunez is a 25 y.o. female with a pmhx of hidradenitis suppurativa, hypertension and PCOS who presents today with concerns for bilateral ear drainage, left worse than right.  Additionally has been having reflux episodes.  Has previously trialed PPI with some success.  We discussed that it would be reasonable for her to go ahead restart a PPI today given her previous improvement.  Also discussed that given her decreased hearing subjectively that audiogram would be indicated.  Following review of her audiogram, she has type a tympanograms bilaterally and relatively normal hearing bilaterally.    *Otalgia  -No evidence of effusion today and that the external auditory canals bilaterally appear healthy and patent.  Discussed that she continues to have pain or new onset of drainage, we would like to see her back to reevaluate.    *LPR  -I called her prescription for Protonix, 20 mg into her pharmacy we discussed that she should take this daily for 2 to 3 months.  We discussed the possibility of flexible laryngoscopy today, the patient declined the procedure.  I endorsed that it would be helpful to have premedication data prior to initiation of a PPI, but at this time she would like to hold off on this procedure.  I encouraged her to contact Danielle Nunez if she continues to have any further issues with globus sensation or difficulty swallowing as this would indicate that she likely would go ahead need to proceed with the procedure in clinic.      Follow Up   Return to clinic in 3 months for follow-up and likely FFL    Subjective:   History of present illness:    Danielle Nunez is seen in consultation at the request of [facility] for evaluation of ear pain.    States that she has been having intermittent drainage from both ears; bilateral hearing loss that is very mild; aural fullness bilaterally, L>R. Intermittent vertiginous episodes in past, not currently having them. Ear pain on both sides.     Past Medical and Surgical History:     Past Medical History:   Diagnosis Date   ??? Hidradenitis suppurativa    ??? Hypertension    ??? PCOS (polycystic ovarian syndrome)        No past surgical history on file.      Family History:     family history includes Diabetes in her father; Hypertension in her father; Thyroid disease in her father.    Social History:     Tobacco use:   Social History     Tobacco Use   Smoking Status Former Smoker   Smokeless Tobacco Never Used     Alcohol use:   Social History     Substance and Sexual Activity   Alcohol Use Yes   ??? Alcohol/week: 3.0 standard drinks   ??? Types: 3 Cans of beer per week    Comment: social     Drug use:   Social History     Substance and Sexual Activity   Drug Use Never       Allergies:     Allergies   Allergen Reactions   ??? Amoxicillin Hives   ??? Humira [Adalimumab]    ??? Penicillins Hives       Medications:       Current Outpatient Medications:   ???  empty  container Misc, Use as directed to dispose of Humira pens, Disp: 1 each, Rfl: 3  ???  etonogestreL-ethinyl estradioL (NUVARING) 0.12-0.015 mg/24 hr vaginal ring, Insert 1 each into the vagina every twenty-one (21) days. Insert one (1) ring vaginally and leave in place for three (3) weeks, then remove for one (1) week., Disp: 1 each, Rfl: 11  ???  hydrOXYzine (ATARAX) 25 MG tablet, Take 25 mg by mouth., Disp: , Rfl:   ???  ibuprofen (MOTRIN) 600 MG tablet, Take 600 mg by mouth., Disp: , Rfl:   ???  sertraline (ZOLOFT) 25 MG tablet, Take 25 mg by mouth daily., Disp: , Rfl:     Review of Systems  Review of Systems:  As above and, otherwise the balance of 11 systems was negative.    Objective:     Vital Signs  Vitals:    06/17/20 0954   BP: 132/83   Pulse: 93   Resp: 20   Temp: 36.7 ??C       Physical Exam  Constitutional:  Vitals reviewed on nursing chart, patient has normal appearance and voice.  Respiration:  Breathing comfortably, no stridor.  Equal and symmetric chest rise.  CV: Regular rate and rhythm for age.  No evidence of peripheral edema.  Eyes:  EOM Intact, sclera normal.   Neuro:  Alert and oriented times 3, moves all 4 extremities spontaneously.  Head and Face:  Skin with no masses or lesions, sinuses nontender to palpation.  Equal and symmetric brow raise bilaterally.  Muscles of facial expression also symmetric bilaterally.  Full voluntary eye closure bilaterally.  No evidence of paresis along bilateral facial nerve distribution.  Ears:  Bilateral TM intact, no MEE. EAC patent bilaterally. Normal appearing external ears bilaterally without otorrhea.  Nose:  External nose midline, anterior rhinoscopy is normal with limited visualization just to the anterior inferior turbinate.   Oral Cavity/Oropharynx/Lips:  Normal mucous membranes, normal floor of mouth/tongue/OP, no masses or lesions are noted.  Neck/Lymph:  No LAD, no thyroid masses.    Audiogram        Portions of this note were completed with dictation assistance. While not intended, typographical errors may occur and could change the intended meaning of an assessment or plan. If there are questions or concerns about portions of the note, please contact the signing physician directly.      Andris Flurry. Lindi Adie, MD, PhD     [facility]  Department of Otolaryngology/Head & Neck Surgery  Resident Physician, PGY-3  Pager [phone]

## 2020-06-20 DIAGNOSIS — K219 Gastro-esophageal reflux disease without esophagitis: Principal | ICD-10-CM

## 2020-06-20 MED ORDER — PANTOPRAZOLE 20 MG TABLET,DELAYED RELEASE
ORAL_TABLET | Freq: Every day | ORAL | 3 refills | 30.00000 days | Status: CP
Start: 2020-06-20 — End: 2021-06-20

## 2020-06-20 NOTE — Unmapped (Signed)
Patient called saying she never received Protonix script. Gerri Spore hasn't finished his note but put in Protonix 20 mg once daily. Told her if it didn't work then we can up her dose to 40 mg daily. Patient understands and said she would cause if any issues arise.

## 2020-06-22 NOTE — Unmapped (Signed)
REFLUX INFORMATION SHEET      What is GERD?   Gastroesophageal reflux (GERD) occurs when acid from the stomach backs up into the esophagus (food pipe).  The lower esophageal sphincter (LES) is a ring of muscle at the bottom of esophagus that contracts to prevent stomach contents from refluxing back into the esophagus.  In patients with GERD, the LES may not be functioning properly and the stomach acid backs up into the esophagus and can damage the lining.    What is LPR?   Laryngopharyngeal reflux or reflux laryngitis occurs when the contents of the stomach reflux beyond the esophagus and into the back of the throat (larynx and pharynx) and possibly the back of the nasal airway.     **GERD and LPR can occur together; patients can also have GERD without LPR or LPR without GERD. 50% of patients with LPR have no heartburn.     Comparing GERD and LPR:      Gastroesophageal reflux/GERD Laryngopharyngeal reflux/LPR   Symptoms  Heartburn, regurgitation, nausea, trouble swallowing, chest pain, dry cough, bad breath Hoarseness, vocal fatigue, throat clearing, cough, swallowing difficulties, nasal drainage, feel a ???lump in the throat???, ???phlegm???, bitter taste, and sometimes trouble breathing   Findings that may be found on diagnostic testing Esophagitis, lower esophageal dysfunction Laryngeal inflammation, upper esophageal dysfunction   Complications that can occur Change in esophageal lining called Barrett???s esophagus that has a small chance of turning into cancer Vocal disorders including vocal nodules and contact granulomas, sinus and ear infections   Pattern of reflux Occurs when lying flat and often after meals Can occur when upright/day   Treatment Often treated by gastroenterologist; treatment can include: PPI (proton pump inhibitor), H2 blocker, diet and lifestyle changes Often treated by an ENT,  treatment can include: PPI (proton pump inhibitor), H2 blocker, diet and lifestyle changes            Division of Voice & Swallowing Disorders  Department of Otolaryngology-Head & Neck Surgery  [facility] [employer] [facility]  [email or web address]  ~  Clinic Phone:  [phone]  Clinic Fax:  [phone]    Coping with Reflux = Diet and Lifestyle Changes    1. Avoid foods that commonly aggravate symptoms:  spicy, tomato-based, fatty, chocolate, citrus fruits, fruit juices, coffee, tea, alcohol, soft drinks with carbonation.           2. Watch your weight???being overweight increased abdominal pressure and increases reflux.       3. Eat small meals, do NOT over eat.      4. Wait 2-4 hours after eating before exercise OR before lying down.       5. Elevate head of bed with blocks.      6. Stop smoking.   7. Take reflux medication, if directed to do so by your physician.   ??? PPI/proton pump inhibitors (reduces acid production by the stomach); should be taken 30-60 min before meals.  Do not take within 2 hours of Synthroid/levothyroxine.    - Examples:  Prilosec/omeprazole, Prevacid/lansoprazole, Nexium/Esomeprazole, Dexilant/dexlansoprazole.  - Risks/side effects include but not limited to: headache, nausea, abdominal pain, possible changes in mineral/vitamin absorption, or very rare cases of acute kidney problems.  ??? H2 blockers (decreases stomach secretion of acid), often taken at bedtime.   - Examples: Zantac/ranitidine, Pepcid/famotidine, Tagamet/cimetidine.  - Risks/side effects include but not limited to: dizziness, nausea, rarely confusion, abnormal heart rhythm  -

## 2020-06-23 DIAGNOSIS — N1330 Unspecified hydronephrosis: Principal | ICD-10-CM

## 2020-07-05 DIAGNOSIS — R1310 Dysphagia, unspecified: Principal | ICD-10-CM

## 2020-07-05 DIAGNOSIS — R1010 Upper abdominal pain, unspecified: Principal | ICD-10-CM

## 2020-07-05 NOTE — Unmapped (Signed)
F/u on in-basket below--will enter referral for general GI and give patient the phone number for the scheduling line.  Lanora Manis, RN    Janyth Pupa, MD  You 1 hour ago (11:33 AM)     AB    Please refer this patient to general GI - I dont know her but she is messaging me as she is on my open access endoscopy schedule     thanks    Message text       Colon Flattery, MD 1 hour ago (11:01 AM)     Renette Butters      Dr. Ruffin Frederick,  No we have not met yet, I was referred to you all to have the procedure done. I did reach out to my primary doctor and let her know, in turn she only told me to let you know of my symptoms. I have been to the ER twice in the past few months for these issues. They have given me motrin and told me I needed to have a endoscopy or colonoscopy done because they aren't sure whats causing my pain and thats all the more help I have received from the ER.          Janyth Pupa, MD  Kayleen Memos C 2 hours ago (10:33 AM)     AB      Ms. Corine Shelter,  ??  I am not sure that we have met (please forgive me if we have, but I dont see any notes in the [facility] system).  It looks like you are on my schedule for an open access endoscopy in August.  If you are in this much pain, I suggest you contact you referring provider and ask for a clinic referral or an earlier EGD.  Otherwise you may have to go to the ER.         Colon Flattery, MD 2 hours ago (10:05 AM)     Renette Butters      Hi I wanted to let you know that my esophagus pain has worsened as well as my upper abdominal pain. Originally I was only hurting on my upper right side, but as of a few days ago I am feeling the same type of pain on my left side as well.   My esophagus I'm worried about even more so. It is difficult to swallow and there is a constant feeling of choking that never really gets better.   My primary doctor prescribed me famotidine for my acid reflux but I've had to stop taking that because it makes my reflux/esophagus issues worse.   If there is any resolve you can offer I would greatly appreciate it because the endoscopy in August feels years away with the pain I'm in.

## 2020-07-07 ENCOUNTER — Encounter: Admit: 2020-07-07 | Discharge: 2020-07-08 | Payer: PRIVATE HEALTH INSURANCE

## 2020-07-12 ENCOUNTER — Encounter: Admit: 2020-07-12 | Discharge: 2020-07-12 | Payer: PRIVATE HEALTH INSURANCE

## 2020-07-12 ENCOUNTER — Encounter
Admit: 2020-07-12 | Discharge: 2020-07-12 | Payer: PRIVATE HEALTH INSURANCE | Attending: Student in an Organized Health Care Education/Training Program | Primary: Student in an Organized Health Care Education/Training Program

## 2020-07-12 MED ORDER — OMEPRAZOLE MAGNESIUM 20 MG TABLET,DELAYED RELEASE
ORAL_TABLET | Freq: Every day | ORAL | 3 refills | 90 days | Status: CP
Start: 2020-07-12 — End: 2021-07-12

## 2020-07-12 MED ORDER — OMEPRAZOLE 20 MG TABLET,DELAYED RELEASE
ORAL_TABLET | 3 refills | 0 days
Start: 2020-07-12 — End: ?

## 2020-07-12 NOTE — Unmapped (Signed)
Flexible endoscope serial number [id number] used today by Edison Pace.D.

## 2020-07-13 NOTE — Unmapped (Signed)
Otolaryngology New Consultation Visit Note      Reason for Visit:  Throat pain follow up    History of Present Illness    Danielle Nunez is 25 y.o. female being seen in consultation at the request of [facility].  She has a past medical history of hidradenitis suppurativa, hypertension, and PCOS.  She initially presented with bilateral otorrhea, left greater than right and concern for reflux.  She explains that her ear pain has improved and her drainage has resolved.  Otherwise, she reports continued throat pain which has been present for the last 2 months.  She unfortunately did not receive her prescribed omeprazole at the last clinic visit.  She has in the interim made intentional lifestyle changes which have provided some improvement.  Otherwise she denies any voice changes.  She does endorse some voice fatigue at the end of the day.  Breathing without issue.  Swallowing without issue.    Past Medical History     has a past medical history of Hidradenitis suppurativa, Hypertension, and PCOS (polycystic ovarian syndrome).    Past Surgical History     has no past surgical history on file.    Current Medications    Current Outpatient Medications   Medication Sig Dispense Refill   ??? etonogestreL-ethinyl estradioL (NUVARING) 0.12-0.015 mg/24 hr vaginal ring Insert 1 each into the vagina every twenty-one (21) days. Insert one (1) ring vaginally and leave in place for three (3) weeks, then remove for one (1) week. 1 each 11   ??? hydrOXYzine (ATARAX) 25 MG tablet Take 25 mg by mouth.     ??? metFORMIN (GLUCOPHAGE) 500 MG tablet      ??? sertraline (ZOLOFT) 25 MG tablet Take 25 mg by mouth daily.     ??? omeprazole (PRILOSEC OTC) 20 MG tablet Take 2 tablets (40 mg total) by mouth in the morning. 180 tablet 3   ??? pantoprazole (PROTONIX) 20 MG tablet Take 1 tablet (20 mg total) by mouth daily. (Patient not taking: Reported on 07/12/2020) 30 tablet 3     No current facility-administered medications for this visit.       Allergies    Allergies   Allergen Reactions   ??? Amoxicillin Hives   ??? Humira [Adalimumab]    ??? Penicillins Hives       Family History    family history includes Diabetes in her father; Hypertension in her father; Thyroid disease in her father.    Social History:     reports that she has quit smoking. She has never used smokeless tobacco.   reports previous alcohol use of about 3.0 standard drinks of alcohol per week.   reports no history of drug use.    Review of Systems    A full 12-system review of systems is documented and negative except as noted in HPI.  Patient intake form reviewed.    Vital Signs  Temperature 36.7 ??C, height 167.6 cm (5' 6), weight (!) 133.9 kg (295 lb 3.2 oz).    Physical Exam    Constitutional:?? Vitals reviewed on nursing chart, patient has normal appearance and voice.  Respiration:?? Breathing comfortably, no stridor.  Equal and symmetric chest rise.  CV: Regular rate and rhythm for age.  No evidence of peripheral edema.  Eyes:?? EOM Intact, sclera normal.   Neuro:?? Alert and oriented times 3, moves all 4 extremities spontaneously.  Head and Face:?? Skin with no masses or lesions, sinuses nontender to palpation.  Equal  and symmetric brow raise bilaterally.  Muscles of facial expression also symmetric bilaterally.  Full voluntary eye closure bilaterally.  No evidence of paresis along bilateral facial nerve distribution.  Ears:?? Bilateral TM intact, no MEE. EAC patent bilaterally. Normal appearing external ears bilaterally without otorrhea or lesion.  Nose:?? External nose midline, anterior rhinoscopy is normal with limited visualization just to the anterior inferior turbinate.   Oral Cavity/Oropharynx/Lips:?? Normal mucous membranes, normal floor of mouth/tongue/OP, no masses or lesions are noted.  Neck/Lymph:  No LAD, no thyroid masses.  Nontender to palpation.    Procedures:  Flexible Fiberoptic Nasopharyngolaryngoscopy (CPT O4392387): To better evaluate the patient???s symptoms, fiberoptic laryngoscopy is indicated.  After discussion of risks and benefits, and topical decongestion and anesthesia, the endoscopy was performed without complications. A time out identifying the patient, the procedure, the location of the procedure and any concerns was performed prior to beginning the procedure.    Findings:  Absent adenoidal pad. Bilateral patent Eustachian tube orifices. Healthy, symmetric nasopharyngeal and pharyngeal mucosa with no masses, lesions, or friable mucosa. Minimal pharyngeal cobblestoning. Symmetric base of tongue with clear vallecula bilaterally. No lingual tonsil hypertrophy.      Inspection of the larynx reveals no evidence of supraglottic or glottic masses. Epiglottis crisp. There is bilateral true vocal cord full range of motion.  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  The piriform sinuses are clear bilaterally. No pooling of secretions in the piriform sinuses.           Audiogram  The audiogram was reviewed from 06/17/20        Assessment/Recommendations:    The patient is a 25 y.o. female with a history of  has a past medical history of Hidradenitis suppurativa, Hypertension, and PCOS (polycystic ovarian syndrome). who presents for the evaluation of bilateral otorrhea and persistent throat pain.  Otorrhea has resolved since initial clinic visit and otologic exam benign.  She was unable to receive her omeprazole prescribed at her last visit and throat pain unfortunately persists.  She has had some improvement with lifestyle changes.  Physical exam notable for some cobblestoning with evidence of incomplete vocal cord closure on adduction.  She does not have an appreciably breathy voice, but explains that by the end of the day she has significant vocal fatigue which is likely secondary to compensatory measures.  We will plan to evaluate improvement in throat pain following PPI.  Should it persist, can consider voice therapy.    - Return to clinic in 3 months.    The patient voiced complete understanding of plan as detailed above and is in full agreement.

## 2020-07-14 ENCOUNTER — Encounter: Admit: 2020-07-14 | Discharge: 2020-07-15 | Payer: PRIVATE HEALTH INSURANCE

## 2020-07-16 NOTE — Unmapped (Signed)
Addended by: Tempie Hoist on: 07/16/2020 06:58 AM     Modules accepted: Level of Service

## 2020-07-18 MED ORDER — OMEPRAZOLE 40 MG CAPSULE,DELAYED RELEASE
ORAL_CAPSULE | Freq: Every day | ORAL | 0 refills | 30 days
Start: 2020-07-18 — End: 2020-08-17

## 2020-07-21 DIAGNOSIS — K219 Gastro-esophageal reflux disease without esophagitis: Principal | ICD-10-CM

## 2020-07-21 MED ORDER — OMEPRAZOLE 40 MG CAPSULE,DELAYED RELEASE
ORAL_CAPSULE | Freq: Every day | ORAL | 3 refills | 90 days | Status: CP
Start: 2020-07-21 — End: ?

## 2020-07-21 NOTE — Unmapped (Signed)
Patient states the tablet form of Prilosec is $100 and pharmacy states the capsule form is a lot cheaper. I informed patient it looks like we sent in capsule form. Patient currently taking Prilosec OTC.     Patient called back and stated pharmacy never received the capsule form prescription.     I left message for patient that I resent prescription to [facility] - any further problems give [place] a call back

## 2020-07-25 DIAGNOSIS — L989 Disorder of the skin and subcutaneous tissue, unspecified: Principal | ICD-10-CM

## 2020-07-25 MED ORDER — MUPIROCIN 2 % TOPICAL OINTMENT
Freq: Two times a day (BID) | TOPICAL | 3 refills | 0.00000 days | Status: CP
Start: 2020-07-25 — End: 2020-08-01

## 2020-07-27 ENCOUNTER — Encounter: Admit: 2020-07-27 | Discharge: 2020-07-28 | Payer: PRIVATE HEALTH INSURANCE

## 2020-07-27 DIAGNOSIS — L732 Hidradenitis suppurativa: Principal | ICD-10-CM

## 2020-07-27 DIAGNOSIS — G4700 Insomnia, unspecified: Principal | ICD-10-CM

## 2020-07-27 DIAGNOSIS — E282 Polycystic ovarian syndrome: Principal | ICD-10-CM

## 2020-07-27 DIAGNOSIS — F32A Anxiety and depression: Principal | ICD-10-CM

## 2020-07-27 DIAGNOSIS — K760 Fatty (change of) liver, not elsewhere classified: Principal | ICD-10-CM

## 2020-07-27 DIAGNOSIS — F419 Anxiety disorder, unspecified: Principal | ICD-10-CM

## 2020-07-27 MED ORDER — HYDROXYZINE HCL 25 MG TABLET
ORAL_TABLET | Freq: Every evening | ORAL | 1 refills | 0 days | Status: CP | PRN
Start: 2020-07-27 — End: ?

## 2020-07-27 MED ORDER — SERTRALINE 25 MG TABLET
ORAL_TABLET | Freq: Every day | ORAL | 1 refills | 90 days | Status: CP
Start: 2020-07-27 — End: ?

## 2020-07-27 MED ORDER — METFORMIN 500 MG TABLET
ORAL_TABLET | Freq: Every day | ORAL | 3 refills | 90 days | Status: CP
Start: 2020-07-27 — End: ?

## 2020-07-27 NOTE — Unmapped (Signed)
Assessment and Plan:     Danielle Nunez was seen today for establish care and medication refill.    Diagnoses and all orders for this visit:    PCOS (polycystic ovarian syndrome)/Obesity  -     metFORMIN (GLUCOPHAGE) 500 MG tablet; Take 1 tablet (500 mg total) by mouth in the morning.  -    Nuva- Ring as prescribed by GYN  -    Diet advice, portion control, activity most days.  -    Offered, patient declined referral to registered dietitian--aware of resources if needed.    Insomnia, unspecified type  -     hydrOXYzine (ATARAX) 25 MG tablet; Take 1 tablet (25 mg total) by mouth at bedtime as needed.  -     Sleep hygiene, stress management.  This may have restless leg syndrome, declined further treatment/interventions for this.    Anxiety and depression  -     sertraline (ZOLOFT) 25 MG tablet; Take 1 tablet (25 mg total) by mouth in the morning.        -     Patient feels mood stable at this dose, declined referral for therapy    Hydradenitis           -Treatment on hold after side effect from Humira         -Pefers to get other health issues investigated and treated prior to proceeding with additional treatment modalities.    Fatty liver disease, nonalcoholic          -Liver enzymes and bilirubin : Normal.        -Has upcoming GI appointments.  Healthy lifestyle choices: Activity, cut back on carbs/processed foods.        -Has stopped alcohol.    Prevention: Up-to-date with Pap smear.  Education on COVID booster and tetanus vaccines.  Barriers to recommended plan: financial    Return if symptoms worsen or fail to improve, for 3-6 Months .      Subjective:     HPI: Danielle Nunez is a 25 y.o. female here for Establish Care (Reports having Right side flank pain ongoing for three months ) and Medication Refill.    Stomach pains/esophageal pain:  Since Took 2nd injection w/humara, constant pain under RUQ--radiates to upper quadrants of stomach  Cramping pain.  Feels regardless of liquid, soft/hard foods--liquids and foods get stuck in esophagus, hard time swallowing  Taking PPI, recommended by ENT, has endoscopy scheduled 08/22  During ED visit (4/22): Has had abdominal CT, pelvic ultrasound and right upper quadrant ultrasound on April, 2022  Nonalcoholic fatty liver noticed on right upper quadrant ultrasound, although liver enzyme tests are normal, bilirubin normal.  Abdominal CT revealed mild right-sided hydronephrosis--which radiologist thought secondary to passed kidney stone.  Patient states she does not have right flank or right lower back pain.  Does have GI specialist appointment scheduled at [facility] July, 2022 as well as [facility] in August  Stopped drinking, no tobacco, no drugs.    Mood/sleep:  Feels 25mg  sertraline helpful--- has tried to increase dose to 50 mg, felt too anxious with higher dose  Patient states for years has been bad sleeper, takes hydroxyzine most nights to assist with sleep  Thinks she does have restless legs, denies history of snoring or apnea.  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    Prevention:  Patient thinks she has had HPV vaccines.  Discussed tetanus and COVID boosters  Up-to-date with Pap smear.    I have reviewed past medical, surgical, medications, allergies, social and family histories today and updated them in Epic where appropriate.    ROS:   PHQ-9 PHQ-9 TOTAL SCORE   07/27/2020 7     GAD7 Total Score GAD-7 Total Score   07/27/2020 12       Review of systems negative unless otherwise noted as per HPI.      Objective:     Vitals:    07/27/20 1039   BP: 138/60   Pulse: 88   Temp: 36.9 ??C (98.4 ??F)   SpO2: 98%     Body mass index is 47.02 kg/m??.    Physical Exam  Vitals and nursing note reviewed.   Constitutional:       Appearance: Normal appearance.   HENT:      Head: Normocephalic and atraumatic.   Cardiovascular:      Rate and Rhythm: Normal rate and regular rhythm.      Pulses: Normal pulses.      Heart sounds: Normal heart sounds.   Pulmonary:      Effort: Pulmonary effort is normal.      Breath sounds: Normal breath sounds.   Abdominal:      General: Abdomen is flat. Bowel sounds are normal. There is no distension.      Palpations: Abdomen is soft. There is no mass.      Tenderness: There is abdominal tenderness. There is no right CVA tenderness, left CVA tenderness, guarding or rebound.      Hernia: No hernia is present.      Comments: Generalized, diffuse tenderness RUQ, no guarding, no masses    Musculoskeletal:         General: Normal range of motion.      Cervical back: Normal range of motion and neck supple.   Skin:     General: Skin is warm.      Capillary Refill: Capillary refill takes less than 2 seconds.   Neurological:      General: No focal deficit present.      Mental Status: She is alert and oriented to person, place, and time. Mental status is at baseline.   Psychiatric:         Mood and Affect: Mood normal.         Behavior: Behavior normal.         Thought Content: Thought content normal.         Judgment: Judgment normal.            Medication adherence and barriers to the treatment plan have been addressed. Opportunities to optimize healthy behaviors have been discussed. Patient / caregiver voiced understanding.   I personally spent 35 minutes face-to-face and non-face-to-face in the care of this patient, which includes all pre, intra, and post visit time on the date of service.  Cleon Dew, DNP, FNP-C  [facility]  [phone] (F)    Note -  This record has been created using [employer]. Chart creation errors have been sought, but may not always have been located. Such creation errors do not reflect on the standard of medical care.

## 2020-08-05 DIAGNOSIS — R079 Chest pain, unspecified: Principal | ICD-10-CM

## 2020-08-19 NOTE — Unmapped (Signed)
Specialty Medication(s): Cosentyx    Ms.Ives has been dis-enrolled from the [facility] specialty pharmacy services due to patient decided to not pursue Cosentyx mfg assistance (see referrals tab for details)..    Additional information provided to the patient: NA    Keirstyn Aydt A Desiree Lucy [facility] Specialty Pharmacist

## 2020-08-22 ENCOUNTER — Other Ambulatory Visit: Payer: Self-pay

## 2020-08-25 ENCOUNTER — Encounter: Payer: Self-pay | Admitting: Gastroenterology

## 2020-08-25 ENCOUNTER — Ambulatory Visit (INDEPENDENT_AMBULATORY_CARE_PROVIDER_SITE_OTHER): Payer: BC Managed Care – PPO | Admitting: Gastroenterology

## 2020-08-25 ENCOUNTER — Other Ambulatory Visit: Payer: Self-pay

## 2020-08-25 VITALS — BP 146/88 | HR 102 | Temp 98.3°F | Ht 66.0 in | Wt 286.4 lb

## 2020-08-25 DIAGNOSIS — K639 Disease of intestine, unspecified: Secondary | ICD-10-CM

## 2020-08-25 DIAGNOSIS — R140 Abdominal distension (gaseous): Secondary | ICD-10-CM | POA: Diagnosis not present

## 2020-08-25 DIAGNOSIS — R933 Abnormal findings on diagnostic imaging of other parts of digestive tract: Secondary | ICD-10-CM

## 2020-08-25 DIAGNOSIS — R1010 Upper abdominal pain, unspecified: Secondary | ICD-10-CM | POA: Diagnosis not present

## 2020-08-25 DIAGNOSIS — R1013 Epigastric pain: Secondary | ICD-10-CM

## 2020-08-25 MED ORDER — NA SULFATE-K SULFATE-MG SULF 17.5-3.13-1.6 GM/177ML PO SOLN: 354.0000 mL | Freq: Once | ORAL | 0 refills | Status: AC

## 2020-08-25 NOTE — Progress Notes (Signed)
Abigail Repress, MD [address]  [address]  [place], [place] [address]  Main: [phone]  Fax: [phone]    Gastroenterology Consultation  Referring Provider:     [name], NP Primary Care Physician:  [name], NP Primary Gastroenterologist:  Dr. Arlyss Hale Reason for Consultation: Abdominal pain, abdominal bloating        HPI:   Abigail Hale is a 25 y.o. female referred by Dr. [name], NP  for consultation & management of abdominal pain and bloating.  Patient went to ER in April 2022 at [facility] for acute episode of upper abdominal pain associated with burning in the stomach as well as burning in the chest, nausea and abdominal bloating.  She underwent CT abdomen and pelvis which revealed nonspecific thickening of the ascending colon and cecum.  Ultrasound abdomen did not reveal any cholelithiasis but the gallbladder was physiologically distended.  She has been started on omeprazole 40 mg once daily which helps with her burning symptoms.  However, she continues to have ongoing upper abdominal discomfort.  She denies any change in her bowel habits.  She denies any rectal bleeding.  She does have history of hidradenitis suppurativa since age of 89.  She tried Humira, unfortunately developed hives and therefore discontinued.  Patient has been on several rounds of antibiotics for last 10 to 15 years.  Patient reports that she underwent H. pylori breath test and it was negative, this was about a month ago. Patient has history of PCOS, she stopped taking metformin by herself  NSAIDs: None  Antiplts/Anticoagulants/Anti thrombotics: None  GI Procedures: None She denies family history of GI malignancy  Past Medical History:  Diagnosis Date   Depression    Hypertension    Insomnia    PCOS (polycystic ovarian syndrome)     Past Surgical History:  Procedure Laterality Date   TONSILLECTOMY      Current Outpatient Medications:    etonogestrel-ethinyl  estradiol (NUVARING) 0.12-0.015 MG/24HR vaginal ring, Place vaginally., Disp: , Rfl:    Na Sulfate-K Sulfate-Mg Sulf 17.5-3.13-1.6 GM/177ML SOLN, Take 354 mLs by mouth once for 1 dose., Disp: 354 mL, Rfl: 0   omeprazole (PRILOSEC) 40 MG capsule, Take by mouth., Disp: , Rfl:    Family History  Problem Relation Age of Onset   Healthy Mother    Diabetes Father      Social History   Tobacco Use   Smoking status: Never   Smokeless tobacco: Never  Vaping Use   Vaping Use: Never used  Substance Use Topics   Alcohol use: Yes    Comment: socially   Drug use: Never    Allergies as of 08/25/2020 - Review Complete 08/25/2020  Allergen Reaction Noted   Amoxicillin Hives 08/15/2017   Penicillins Hives 08/15/2017    Review of Systems:    All systems reviewed and negative except where noted in HPI.   Physical Exam:  BP (!) 146/88 (BP Location: Left Arm, Patient Position: Sitting, Cuff Size: Large)   Pulse (!) 102   Temp 98.3 F (36.8 C) (Oral)   Ht 5\' 6"  (1.676 m)   Wt 286 lb 6 oz (129.9 kg)   BMI 46.22 kg/m  No LMP recorded.  General:   Alert,  Well-developed, well-nourished, pleasant and cooperative in NAD Head:  Normocephalic and atraumatic. Eyes:  Sclera clear, no icterus.   Conjunctiva pink. Ears:  Normal auditory acuity. Nose:  No deformity, discharge, or lesions. Mouth:  No deformity or  lesions,oropharynx pink & moist. Neck:  Supple; no masses or thyromegaly. Lungs:  Respirations even and unlabored.  Clear throughout to auscultation.   No wheezes, crackles, or rhonchi. No acute distress. Heart:  Regular rate and rhythm; no murmurs, clicks, rubs, or gallops. Abdomen:  Normal bowel sounds. Soft, obese, non-tender and non-distended without masses, hepatosplenomegaly or hernias noted.  No guarding or rebound tenderness.   Rectal: Not performed Msk:  Symmetrical without gross deformities. Good, equal movement & strength bilaterally. Pulses:  Normal pulses  noted. Extremities:  No clubbing or edema.  No cyanosis. Neurologic:  Alert and oriented x3;  grossly normal neurologically. Skin:  Intact without significant lesions or rashes. No jaundice. Psych:  Alert and cooperative. Normal mood and affect.  Imaging Studies: Reviewed  Assessment and Plan:   Abigail Hale is a 25 y.o. Caucasian female with history of PCOS, fatty liver, hidradenitis suppurativa is seen in consultation for 3 months history of upper abdominal discomfort associated with abdominal bloating  Epigastric burning pain and heartburn Currently relieved on omeprazole 40 mg daily Continue with his current dose  Upper abdominal discomfort, abdominal bloating and right colon thickening on the CT scan Given patient's history of long-term antibiotic use for hidradenitis suppurativa, differentials include IBD or bacterial overgrowth Recommend colonoscopy for further evaluation with TI evaluation Recommend upper endoscopy with gastric and duodenal biopsies If above work-up is unremarkable, will empirically treat with 2 weeks course of rifaximin for bacterial overgrowth Can consider exocrine pancreatic insufficiency   Follow up in 3 months   Abigail Repress, MD

## 2020-08-26 ENCOUNTER — Encounter
Admit: 2020-08-26 | Discharge: 2020-08-27 | Disposition: A | Discharge status: Home / Self Care | Payer: PRIVATE HEALTH INSURANCE

## 2020-08-26 ENCOUNTER — Emergency Department
Admit: 2020-08-26 | Discharge: 2020-08-27 | Disposition: A | Discharge status: Home / Self Care | Payer: PRIVATE HEALTH INSURANCE

## 2020-09-02 ENCOUNTER — Encounter: Admit: 2020-09-02 | Discharge: 2020-09-03 | Payer: PRIVATE HEALTH INSURANCE

## 2020-09-02 DIAGNOSIS — R079 Chest pain, unspecified: Principal | ICD-10-CM

## 2020-09-15 ENCOUNTER — Encounter: Payer: Self-pay | Admitting: Gastroenterology

## 2020-09-20 ENCOUNTER — Encounter: Payer: Self-pay | Admitting: Gastroenterology

## 2020-09-20 ENCOUNTER — Other Ambulatory Visit: Payer: Self-pay

## 2020-09-20 MED ORDER — NA SULFATE-K SULFATE-MG SULF 17.5-3.13-1.6 GM/177ML PO SOLN: 1.0000 | ORAL | 0 refills | Status: DC

## 2020-09-20 NOTE — Telephone Encounter (Signed)
Suprep was sent in at her appointment and was also sent today. Called pharmacy to check the status of medication. She states they have the medication is working on getting it filled should be ready in the next hour. Called patient and patient verbalized understanding

## 2020-09-22 ENCOUNTER — Encounter
Admission: RE | Disposition: A | Discharge status: Home / Self Care | Payer: Self-pay | Source: Home / Self Care | Attending: Gastroenterology

## 2020-09-22 ENCOUNTER — Other Ambulatory Visit: Payer: Self-pay

## 2020-09-22 ENCOUNTER — Ambulatory Visit
Admission: RE | Admit: 2020-09-22 | Discharge: 2020-09-22 | Disposition: A | Discharge status: Home / Self Care | Payer: BC Managed Care – PPO | Attending: Gastroenterology | Admitting: Gastroenterology

## 2020-09-22 ENCOUNTER — Encounter: Payer: Self-pay | Admitting: Gastroenterology

## 2020-09-22 ENCOUNTER — Ambulatory Visit: Payer: BC Managed Care – PPO | Admitting: Anesthesiology

## 2020-09-22 DIAGNOSIS — E119 Type 2 diabetes mellitus without complications: Secondary | ICD-10-CM | POA: Insufficient documentation

## 2020-09-22 DIAGNOSIS — R933 Abnormal findings on diagnostic imaging of other parts of digestive tract: Secondary | ICD-10-CM | POA: Diagnosis not present

## 2020-09-22 DIAGNOSIS — Z7984 Long term (current) use of oral hypoglycemic drugs: Secondary | ICD-10-CM | POA: Insufficient documentation

## 2020-09-22 DIAGNOSIS — F458 Other somatoform disorders: Secondary | ICD-10-CM | POA: Diagnosis not present

## 2020-09-22 DIAGNOSIS — Z833 Family history of diabetes mellitus: Secondary | ICD-10-CM | POA: Insufficient documentation

## 2020-09-22 DIAGNOSIS — R1031 Right lower quadrant pain: Secondary | ICD-10-CM | POA: Insufficient documentation

## 2020-09-22 DIAGNOSIS — Z793 Long term (current) use of hormonal contraceptives: Secondary | ICD-10-CM | POA: Diagnosis not present

## 2020-09-22 DIAGNOSIS — R1013 Epigastric pain: Secondary | ICD-10-CM

## 2020-09-22 DIAGNOSIS — Z79899 Other long term (current) drug therapy: Secondary | ICD-10-CM | POA: Insufficient documentation

## 2020-09-22 DIAGNOSIS — Z880 Allergy status to penicillin: Secondary | ICD-10-CM | POA: Insufficient documentation

## 2020-09-22 HISTORY — DX: Fatty (change of) liver, not elsewhere classified: K76.0

## 2020-09-22 HISTORY — PX: COLONOSCOPY WITH PROPOFOL: SHX5780

## 2020-09-22 HISTORY — PX: ESOPHAGOGASTRODUODENOSCOPY (EGD) WITH PROPOFOL: SHX5813

## 2020-09-22 LAB — POCT PREGNANCY, URINE: Preg Test, Ur: NEGATIVE

## 2020-09-22 SURGERY — COLONOSCOPY WITH PROPOFOL
Anesthesia: General | Site: Throat

## 2020-09-22 MED ORDER — PROMETHAZINE HCL 25 MG/ML IJ SOLN: 6.2500 mg | INTRAMUSCULAR | Status: DC | PRN

## 2020-09-22 MED ORDER — LACTATED RINGERS IV SOLN: INTRAVENOUS | Status: DC

## 2020-09-22 MED ORDER — STERILE WATER FOR IRRIGATION IR SOLN
Status: DC | PRN
  Administered 2020-09-22: .05 mL

## 2020-09-22 MED ORDER — OXYCODONE HCL 5 MG PO TABS: 5.0000 mg | ORAL_TABLET | Freq: Once | ORAL | Status: DC | PRN

## 2020-09-22 MED ORDER — OXYCODONE HCL 5 MG/5ML PO SOLN: 5.0000 mg | Freq: Once | ORAL | Status: DC | PRN

## 2020-09-22 MED ORDER — PROPOFOL 10 MG/ML IV BOLUS
INTRAVENOUS | Status: DC | PRN
  Administered 2020-09-22 (×2): 50 mg via INTRAVENOUS
  Administered 2020-09-22: 150 mg via INTRAVENOUS
  Administered 2020-09-22: 30 mg via INTRAVENOUS
  Administered 2020-09-22 (×2): 50 mg via INTRAVENOUS
  Administered 2020-09-22: 30 mg via INTRAVENOUS
  Administered 2020-09-22 (×3): 50 mg via INTRAVENOUS
  Administered 2020-09-22: 40 mg via INTRAVENOUS
  Administered 2020-09-22 (×2): 50 mg via INTRAVENOUS

## 2020-09-22 MED ORDER — GLYCOPYRROLATE 0.2 MG/ML IJ SOLN
INTRAMUSCULAR | Status: DC | PRN
  Administered 2020-09-22: .1 mg via INTRAVENOUS

## 2020-09-22 MED ORDER — LIDOCAINE HCL (CARDIAC) PF 100 MG/5ML IV SOSY
PREFILLED_SYRINGE | INTRAVENOUS | Status: DC | PRN
  Administered 2020-09-22: 30 mg via INTRAVENOUS

## 2020-09-22 MED ORDER — MEPERIDINE HCL 25 MG/ML IJ SOLN: 6.2500 mg | INTRAMUSCULAR | Status: DC | PRN

## 2020-09-22 MED ORDER — SODIUM CHLORIDE 0.9 % IV SOLN: INTRAVENOUS | Status: DC

## 2020-09-22 MED ORDER — FENTANYL CITRATE PF 50 MCG/ML IJ SOSY: 25.0000 ug | PREFILLED_SYRINGE | INTRAMUSCULAR | Status: DC | PRN

## 2020-09-22 SURGICAL SUPPLY — 8 items
BLOCK BITE 60FR ADLT L/F GRN (MISCELLANEOUS) ×3 IMPLANT
FORCEPS BIOP RAD 4 LRG CAP 4 (CUTTING FORCEPS) ×6 IMPLANT
GOWN CVR UNV OPN BCK APRN NK (MISCELLANEOUS) ×4 IMPLANT
GOWN ISOL THUMB LOOP REG UNIV (MISCELLANEOUS) ×6
KIT PRC NS LF DISP ENDO (KITS) ×2 IMPLANT
KIT PROCEDURE OLYMPUS (KITS) ×3
MANIFOLD NEPTUNE II (INSTRUMENTS) ×3 IMPLANT
WATER STERILE IRR 250ML POUR (IV SOLUTION) ×3 IMPLANT

## 2020-09-22 NOTE — H&P (Signed)
Cephas Darby, MD [address]  [employer]  [place], [place] [address]  Main: [phone]  Fax: [phone] Pager: [phone]  Primary Care Physician:  Juanda Bond, NP Primary Gastroenterologist:  Dr. Cephas Darby  Pre-Procedure History & Physical: HPI:  Abigail Hale is a 25 y.o. female is here for an endoscopy and colonoscopy.   Past Medical History:  Diagnosis Date   Depression    Fatty liver    Hypertension    Insomnia    PCOS (polycystic ovarian syndrome)     Past Surgical History:  Procedure Laterality Date   TONSILLECTOMY      Prior to Admission medications   Medication Sig Start Date End Date Taking? Authorizing Provider  metFORMIN (GLUCOPHAGE) 500 MG tablet Take by mouth daily with breakfast. For PCOS   Yes [provider]  Na Sulfate-K Sulfate-Mg Sulf (SUPREP BOWEL PREP KIT) 17.5-3.13-1.6 GM/177ML SOLN Take 1 kit by mouth as directed. 09/20/20  Yes Sigmund Morera, Tally Due, MD  omeprazole (PRILOSEC) 40 MG capsule Take by mouth. 07/21/20  Yes [provider]  etonogestrel-ethinyl estradiol (NUVARING) 0.12-0.015 MG/24HR vaginal ring Place vaginally. Patient not taking: Reported on 09/15/2020 04/05/20 04/05/21  [provider]  fluticasone (FLONASE) 50 MCG/ACT nasal spray Place 2 sprays into both nostrils daily. 12/23/17 05/19/19  Melynda Ripple, MD    Allergies as of 08/25/2020 - Review Complete 08/25/2020  Allergen Reaction Noted   Amoxicillin Hives 08/15/2017   Penicillins Hives 08/15/2017    Family History  Problem Relation Age of Onset   Healthy Mother    Diabetes Father     Social History   Socioeconomic History   Marital status: Single    Spouse name: Not on file   Number of children: Not on file   Years of education: Not on file   Highest education level: Not on file  Occupational History   Not on file  Tobacco Use   Smoking status: Never   Smokeless tobacco: Never  Vaping Use   Vaping Use: Never used   Substance and Sexual Activity   Alcohol use: Yes    Comment: socially   Drug use: Never   Sexual activity: Not on file  Other Topics Concern   Not on file  Social History Narrative   Not on file   Social Determinants of Health   Financial Resource Strain: Not on file  Food Insecurity: Not on file  Transportation Needs: Not on file  Physical Activity: Not on file  Stress: Not on file  Social Connections: Not on file  Intimate Partner Violence: Not on file    Review of Systems: See HPI, otherwise negative ROS  Physical Exam: BP (!) 155/98   Pulse (!) 112   Temp 99.9 F (37.7 C)   Resp 20   Ht _0  (1.676 m)   Wt 127.9 kg   SpO2 97%   BMI 45.52 kg/m  General:   Alert,  pleasant and cooperative in NAD Head:  Normocephalic and atraumatic. Neck:  Supple; no masses or thyromegaly. Lungs:  Clear throughout to auscultation.    Heart:  Regular rate and rhythm. Abdomen:  Soft, nontender and nondistended. Normal bowel sounds, without guarding, and without rebound.   Neurologic:  Alert and  oriented x4;  grossly normal neurologically.  Impression/Plan: Abigail Hale is here for an endoscopy and colonoscopy to be performed for dyspepsia, right colon thickening  Risks, benefits, limitations, and alternatives regarding  endoscopy and colonoscopy have been reviewed with  the patient.  Questions have been answered.  All parties agreeable.   Sherri Sear, MD  09/22/2020, 8:10 AM

## 2020-09-22 NOTE — Anesthesia Procedure Notes (Signed)
Date/Time: 09/22/2020 9:10 AM Performed by: Maree Krabbe, CRNA Pre-anesthesia Checklist: Patient identified, Emergency Drugs available, Suction available, Timeout performed and Patient being monitored Patient Re-evaluated:Patient Re-evaluated prior to induction Oxygen Delivery Method: Nasal cannula Placement Confirmation: positive ETCO2

## 2020-09-22 NOTE — Op Note (Signed)
[facility] Patient Name: Abigail Hale Procedure Date: 09/22/2020 8:46 AM MRN: [id number] Account #: [id number] Date of Birth: 30-May-1995 Admit Type: Outpatient Age: 25 Room: [facility] OR ROOM 01 Gender: Female Note Status: Finalized Procedure:             Upper GI endoscopy Indications:           Dyspepsia, Globus sensation Providers:             Toney Reil MD, MD Medicines:             General Anesthesia Complications:         No immediate complications. Estimated blood loss: None. Procedure:             Pre-Anesthesia Assessment:                        - Prior to the procedure, a History and Physical was                         performed, and patient medications and allergies were                         reviewed. The patient is competent. The risks and                         benefits of the procedure and the sedation options and                         risks were discussed with the patient. All questions                         were answered and informed consent was obtained.                         Patient identification and proposed procedure were                         verified by the physician, the nurse, the                         anesthesiologist, the anesthetist and the technician                         in the pre-procedure area in the procedure room in the                         endoscopy suite. Mental Status Examination: alert and                         oriented. Airway Examination: normal oropharyngeal                         airway and neck mobility. Respiratory Examination:                         clear to auscultation. CV Examination: normal.                         Prophylactic Antibiotics: The patient does not require  prophylactic antibiotics. Prior Anticoagulants: The                         patient has taken no previous anticoagulant or                         antiplatelet agents. ASA Grade  Assessment: II - A                         patient with mild systemic disease. After reviewing                         the risks and benefits, the patient was deemed in                         satisfactory condition to undergo the procedure. The                         anesthesia plan was to use general anesthesia.                         Immediately prior to administration of medications,                         the patient was re-assessed for adequacy to receive                         sedatives. The heart rate, respiratory rate, oxygen                         saturations, blood pressure, adequacy of pulmonary                         ventilation, and response to care were monitored                         throughout the procedure. The physical status of the                         patient was re-assessed after the procedure.                        After obtaining informed consent, the endoscope was                         passed under direct vision. Throughout the procedure,                         the patient's blood pressure, pulse, and oxygen                         saturations were monitored continuously. The was                         introduced through the mouth, and advanced to the                         second part of duodenum. The upper GI endoscopy was  accomplished without difficulty. The patient tolerated                         the procedure well. Findings:      The duodenal bulb and second portion of the duodenum were normal.       Biopsies were taken with a cold forceps for histology.      The entire examined stomach was normal. Biopsies were taken with a cold       forceps for Helicobacter pylori testing.      The cardia and gastric fundus were normal on retroflexion.      Esophagogastric landmarks were identified: the gastroesophageal junction       was found at 39 cm from the incisors.      The gastroesophageal junction and examined esophagus  were normal.       Biopsies were taken with a cold forceps for histology. Impression:            - Normal duodenal bulb and second portion of the                         duodenum. Biopsied.                        - Normal stomach. Biopsied.                        - Esophagogastric landmarks identified.                        - Normal gastroesophageal junction and esophagus.                         Biopsied. Recommendation:        - Await pathology results.                        - Perform a colonoscopy as previously scheduled. Procedure Code(s):     --- Professional ---                        [phone], Esophagogastroduodenoscopy, flexible,                         transoral; with biopsy, single or multiple Diagnosis Code(s):     --- Professional ---                        R10.13, Epigastric pain                        F45.8, Other somatoform disorders CPT copyright 2019 American Medical Association. All rights reserved. The codes documented in this report are preliminary and upon coder review may  be revised to meet current compliance requirements. Dr. Libby Maw Toney Reil MD, MD 09/22/2020 9:05:00 AM This report has been signed electronically. Number of Addenda: 0 Note Initiated On: 09/22/2020 8:46 AM Estimated Blood Loss:  Estimated blood loss: none.      [facility]

## 2020-09-22 NOTE — Anesthesia Postprocedure Evaluation (Signed)
Anesthesia Post Note  Patient: Abigail Hale  Procedure(s) Performed: COLONOSCOPY WITH PROPOFOL (Rectum) ESOPHAGOGASTRODUODENOSCOPY (EGD) WITH PROPOFOL (Throat)     Patient location during evaluation: PACU Anesthesia Type: General Level of consciousness: awake and alert Pain management: pain level controlled Vital Signs Assessment: post-procedure vital signs reviewed and stable Respiratory status: spontaneous breathing, nonlabored ventilation, respiratory function stable and patient connected to nasal cannula oxygen Cardiovascular status: blood pressure returned to baseline and stable Postop Assessment: no apparent nausea or vomiting Anesthetic complications: no   No notable events documented.  Kaiden Dardis, Salvadore Dom

## 2020-09-22 NOTE — Transfer of Care (Signed)
Immediate Anesthesia Transfer of Care Note  Patient: Abigail Hale  Procedure(s) Performed: COLONOSCOPY WITH PROPOFOL (Rectum) ESOPHAGOGASTRODUODENOSCOPY (EGD) WITH PROPOFOL (Throat)  Patient Location: PACU  Anesthesia Type: General  Level of Consciousness: awake, alert  and patient cooperative  Airway and Oxygen Therapy: Patient Spontanous Breathing and Patient connected to supplemental oxygen  Post-op Assessment: Post-op Vital signs reviewed, Patient's Cardiovascular Status Stable, Respiratory Function Stable, Patent Airway and No signs of Nausea or vomiting  Post-op Vital Signs: Reviewed and stable  Complications: No notable events documented.

## 2020-09-22 NOTE — Op Note (Signed)
[facility] Patient Name: Abigail MemosKayla Lamarque Procedure Date: 09/22/2020 8:43 AM MRN: [id number] Account #: [id number] Date of Birth: 07-09-1995 Admit Type: Outpatient Age: [age] Room: [facility] ROOM 01 Gender: Female Note Status: Finalized Procedure:             Colonoscopy Indications:           This is the patient's first colonoscopy, Abdominal                         pain in the right lower quadrant, Abnormal CT of the                         GI tract Providers:             Toney Reilohini Reddy Juvenal Umar MD, MD Medicines:             General Anesthesia Complications:         No immediate complications. Estimated blood loss: None. Procedure:             Pre-Anesthesia Assessment:                        - Prior to the procedure, a History and Physical was                         performed, and patient medications and allergies were                         reviewed. The patient is competent. The risks and                         benefits of the procedure and the sedation options and                         risks were discussed with the patient. All questions                         were answered and informed consent was obtained.                         Patient identification and proposed procedure were                         verified by the physician, the nurse, the                         anesthesiologist, the anesthetist and the technician                         in the pre-procedure area in the procedure room in the                         endoscopy suite. Mental Status Examination: alert and                         oriented. Airway Examination: normal oropharyngeal                         airway and neck mobility. Respiratory  Examination:                         clear to auscultation. CV Examination: normal.                         Prophylactic Antibiotics: The patient does not require                         prophylactic antibiotics. Prior Anticoagulants: The                          patient has taken no previous anticoagulant or                         antiplatelet agents. ASA Grade Assessment: II - A                         patient with mild systemic disease. After reviewing                         the risks and benefits, the patient was deemed in                         satisfactory condition to undergo the procedure. The                         anesthesia plan was to use general anesthesia.                         Immediately prior to administration of medications,                         the patient was re-assessed for adequacy to receive                         sedatives. The heart rate, respiratory rate, oxygen                         saturations, blood pressure, adequacy of pulmonary                         ventilation, and response to care were monitored                         throughout the procedure. The physical status of the                         patient was re-assessed after the procedure.                        After obtaining informed consent, the colonoscope was                         passed under direct vision. Throughout the procedure,                         the patient's blood pressure, pulse, and oxygen  saturations were monitored continuously. The                         Colonoscope was introduced through the anus and                         advanced to the the terminal ileum, with                         identification of the appendiceal orifice and IC                         valve. The colonoscopy was performed with moderate                         difficulty due to significant looping and the                         patient's body habitus. Successful completion of the                         procedure was aided by applying abdominal pressure.                         The patient tolerated the procedure well. The quality                         of the bowel preparation was evaluated using the BBPS                          [facility] Bowel Preparation Scale) with scores of: Right                         Colon = 3, Transverse Colon = 3 and Left Colon = 3                         (entire mucosa seen well with no residual staining,                         small fragments of stool or opaque liquid). The total                         BBPS score equals 9. Findings:      The perianal and digital rectal examinations were normal. Pertinent       negatives include normal sphincter tone and no palpable rectal lesions.      The terminal ileum appeared normal. Biopsies were taken with a cold       forceps for histology.      The colon (entire examined portion) appeared normal. Biopsies were taken       with a cold forceps for histology.      The retroflexed view of the distal rectum and anal verge was normal and       showed no anal or rectal abnormalities. Impression:            - The examined portion of the ileum was normal.  Biopsied.                        - The entire examined colon is normal. Biopsied.                        - The distal rectum and anal verge are normal on                         retroflexion view. Recommendation:        - Discharge patient to home (with escort).                        - Resume previous diet today.                        - Continue present medications.                        - Await pathology results.                        - Return to my office as previously scheduled. Procedure Code(s):     --- Professional ---                        [phone], Colonoscopy, flexible; with biopsy, single or                         multiple Diagnosis Code(s):     --- Professional ---                        R10.31, Right lower quadrant pain                        R93.3, Abnormal findings on diagnostic imaging of                         other parts of digestive tract CPT copyright 2019 American Medical Association. All rights reserved. The codes documented  in this report are preliminary and upon coder review may  be revised to meet current compliance requirements. Dr. Libby Maw Toney Reil MD, MD 09/22/2020 9:28:23 AM This report has been signed electronically. Number of Addenda: 0 Note Initiated On: 09/22/2020 8:43 AM Scope Withdrawal Time: 0 hours 13 minutes 38 seconds  Total Procedure Duration: 0 hours 17 minutes 48 seconds  Estimated Blood Loss:  Estimated blood loss: none.      [facility]

## 2020-09-22 NOTE — Anesthesia Preprocedure Evaluation (Signed)
Anesthesia Evaluation  Patient identified by MRN, date of birth, ID band Patient awake    Reviewed: Allergy & Precautions, H&P , NPO status , Patient's Chart, lab work & pertinent test results, reviewed documented beta blocker date and time   Airway Mallampati: II  TM Distance: >3 FB Neck ROM: full    Dental no notable dental hx.    Pulmonary neg pulmonary ROS,    Pulmonary exam normal breath sounds clear to auscultation       Cardiovascular Exercise Tolerance: Good hypertension, negative cardio ROS   Rhythm:regular Rate:Normal     Neuro/Psych PSYCHIATRIC DISORDERS Depression negative neurological ROS     GI/Hepatic negative GI ROS, Neg liver ROS,   Endo/Other  negative endocrine ROS  Renal/GU negative Renal ROS  negative genitourinary   Musculoskeletal   Abdominal   Peds  Hematology negative hematology ROS (+)   Anesthesia Other Findings   Reproductive/Obstetrics negative OB ROS                             Anesthesia Physical Anesthesia Plan  ASA: 2  Anesthesia Plan: General   Post-op Pain Management:    Induction:   PONV Risk Score and Plan:   Airway Management Planned:   Additional Equipment:   Intra-op Plan:   Post-operative Plan:   Informed Consent: I have reviewed the patients History and Physical, chart, labs and discussed the procedure including the risks, benefits and alternatives for the proposed anesthesia with the patient or authorized representative who has indicated his/her understanding and acceptance.     Dental Advisory Given  Plan Discussed with: CRNA  Anesthesia Plan Comments:         Anesthesia Quick Evaluation

## 2020-09-23 ENCOUNTER — Encounter: Payer: Self-pay | Admitting: Gastroenterology

## 2020-09-23 LAB — SURGICAL PATHOLOGY

## 2020-09-25 ENCOUNTER — Ambulatory Visit
Admission: RE | Admit: 2020-09-25 | Discharge: 2020-09-25 | Disposition: A | Discharge status: Home / Self Care | Payer: BC Managed Care – PPO | Source: Ambulatory Visit | Attending: Internal Medicine | Admitting: Internal Medicine

## 2020-09-25 ENCOUNTER — Other Ambulatory Visit: Payer: Self-pay

## 2020-09-25 VITALS — BP 138/88 | HR 93 | Temp 99.0°F | Resp 16 | Ht 66.0 in | Wt 285.0 lb

## 2020-09-25 DIAGNOSIS — H9313 Tinnitus, bilateral: Secondary | ICD-10-CM | POA: Diagnosis not present

## 2020-09-25 DIAGNOSIS — H9203 Otalgia, bilateral: Secondary | ICD-10-CM | POA: Diagnosis not present

## 2020-09-25 DIAGNOSIS — J0130 Acute sphenoidal sinusitis, unspecified: Secondary | ICD-10-CM

## 2020-09-25 MED ORDER — FLUTICASONE PROPIONATE 50 MCG/ACT NA SUSP: 2.0000 | Freq: Every day | NASAL | 0 refills | Status: AC

## 2020-09-25 MED ORDER — CEFDINIR 300 MG PO CAPS: 300.0000 mg | ORAL_CAPSULE | Freq: Two times a day (BID) | ORAL | 0 refills | Status: DC

## 2020-09-25 NOTE — ED Triage Notes (Signed)
Pt here with C/O bilateral ear pain/ringing for 14 days. Hasn't tried anything OTC. Denies fever, congestion.

## 2020-09-25 NOTE — Discharge Instructions (Addendum)
Keep Benadryl or Claritin handy at home in case you develop an allergic reaction to Cefdinier. Follow up with ENT if your riming does not resolve

## 2020-09-25 NOTE — ED Provider Notes (Signed)
[facility]    CSN: [id number] Arrival date & time: 09/25/20  6144      History   Chief Complaint Chief Complaint  Patient presents with   Otalgia    Bilateral     HPI Abigail Hale is a 25 y.o. female who has been having bilateral ear pain x 2 weeks as well as nose congestion. Has been having dizziness feeling off balance. She has also been having HA on her forehead and gets worse when she bends over. Has not had a fever. Nose congestion has been with green mucous.     Past Medical History:  Diagnosis Date   Depression    Fatty liver    Hypertension    Insomnia    PCOS (polycystic ovarian syndrome)     Patient Active Problem List   Diagnosis Date Noted   Dyspepsia    Abnormal CT scan, gastrointestinal tract    Fatty liver disease, nonalcoholic 04/05/2020   PCOS (polycystic ovarian syndrome) 07/07/2014   Hydradenitis 07/07/2006    Past Surgical History:  Procedure Laterality Date   COLONOSCOPY WITH PROPOFOL N/A 09/22/2020   Procedure: COLONOSCOPY WITH PROPOFOL;  Surgeon: Toney Reil, MD;  Location: [facility];  Service: Endoscopy;  Laterality: N/A;   ESOPHAGOGASTRODUODENOSCOPY (EGD) WITH PROPOFOL N/A 09/22/2020   Procedure: ESOPHAGOGASTRODUODENOSCOPY (EGD) WITH PROPOFOL;  Surgeon: Toney Reil, MD;  Location: [facility];  Service: Endoscopy;  Laterality: N/A;   TONSILLECTOMY      OB History   No obstetric history on file.      Home Medications    Prior to Admission medications   Medication Sig Start Date End Date Taking? Authorizing Provider  cefdinir (OMNICEF) 300 MG capsule Take 1 capsule (300 mg total) by mouth 2 (two) times daily. 09/25/20  Yes Rodriguez-Southworth, Nettie Elm, PA-C  fluticasone (FLONASE) 50 MCG/ACT nasal spray Place 2 sprays into both nostrils daily. 09/25/20  Yes Rodriguez-Southworth, Nettie Elm, PA-C  metFORMIN (GLUCOPHAGE) 500 MG tablet Take by mouth daily with breakfast. For PCOS   Yes [provider]  omeprazole (PRILOSEC) 40 MG capsule Take by mouth. 07/21/20  Yes [provider]    Family History Family History  Problem Relation Age of Onset   Healthy Mother    Diabetes Father     Social History Social History   Tobacco Use   Smoking status: Never   Smokeless tobacco: Never  Vaping Use   Vaping Use: Never used  Substance Use Topics   Alcohol use: Yes    Comment: socially   Drug use: Never     Allergies   Amoxicillin, Levaquin [levofloxacin], and Penicillins   Review of Systems Review of Systems  Constitutional:  Negative for chills and fever.  HENT:  Positive for congestion, ear pain, sinus pressure, sinus pain and tinnitus. Negative for ear discharge and hearing loss.   Eyes:  Negative for discharge.  Respiratory:  Negative for cough.   Musculoskeletal:  Negative for myalgias.  Skin:  Negative for rash.  Neurological:  Positive for dizziness and headaches.  Hematological:  Negative for adenopathy.    Physical Exam Triage Vital Signs ED Triage Vitals  Enc Vitals Group     BP 09/25/20 1013 138/88     Pulse Rate 09/25/20 1013 93     Resp 09/25/20 1013 16     Temp 09/25/20 1013 99 F (37.2 C)     Temp Source 09/25/20 1013 Oral     SpO2 09/25/20 1013 97 %  Weight 09/25/20 1011 285 lb (129.3 kg)     Height 09/25/20 1011 5\' 6"  (1.676 m)     Head Circumference --      Peak Flow --      Pain Score 09/25/20 1011 7     Pain Loc --      Pain Edu? --      Excl. in GC? --    No data found.  Updated Vital Signs BP 138/88 (BP Location: Left Arm)   Pulse 93   Temp 99 F (37.2 C) (Oral)   Resp 16   Ht 5\' 6"  (1.676 m)   Wt 285 lb (129.3 kg)   LMP 08/29/2020   SpO2 97%   BMI 46.00 kg/m   Visual Acuity Right Eye Distance:   Left Eye Distance:   Bilateral Distance:    Right Eye Near:   Left Eye Near:    Bilateral Near:     Physical Exam Vitals and nursing note reviewed.  Constitutional:      General: She is not in  acute distress.    Appearance: She is obese. She is not toxic-appearing.  HENT:     Head: Normocephalic.     Right Ear: Tympanic membrane, ear canal and external ear normal.     Left Ear: Ear canal and external ear normal.     Ears:     Comments: L TM I dull and gray    Nose: Congestion present.     Comments: Green mucous on L nose. Sinuses are not tender, but HA is provoked when she bends over.     Mouth/Throat:     Mouth: Mucous membranes are moist.     Pharynx: Oropharynx is clear.  Eyes:     General: No scleral icterus.    Conjunctiva/sclera: Conjunctivae normal.  Musculoskeletal:        General: Normal range of motion.     Cervical back: Neck supple.  Lymphadenopathy:     Cervical: No cervical adenopathy.  Neurological:     Mental Status: She is alert and oriented to person, place, and time.     Gait: Gait normal.  Psychiatric:        Mood and Affect: Mood normal.        Behavior: Behavior normal.        Thought Content: Thought content normal.        Judgment: Judgment normal.     UC Treatments / Results  Labs (all labs ordered are listed, but only abnormal results are displayed) Labs Reviewed - No data to display  EKG   Radiology No results found.  Procedures Procedures (including critical care time)  Medications Ordered in UC Medications - No data to display  Initial Impression / Assessment and Plan / UC Course  I have reviewed the triage vital signs and the nursing notes. I am concerned she is getting sphenoid sinusitis, so I placed her on Cefdinier as noted, and Flonase. Since she is allergic to so medications and azithromycin does not cover sinuses well, I chose Cefdinier, therefore I advised her to keep Benadryl or Claritin handy and I reviewed symptoms of allergic reaction to watch out for.      Final Clinical Impressions(s) / UC Diagnoses   Final diagnoses:  Otalgia of both ears  Tinnitus of both ears  Subacute sphenoidal sinusitis      Discharge Instructions      Keep Benadryl or Claritin handy at home in case you develop an allergic  reaction to Cefdinier. Follow up with ENT if your riming does not resolve     ED Prescriptions     Medication Sig Dispense Auth. Provider   cefdinir (OMNICEF) 300 MG capsule Take 1 capsule (300 mg total) by mouth 2 (two) times daily. 20 capsule Rodriguez-Southworth, Raymir Frommelt, PA-C   fluticasone (FLONASE) 50 MCG/ACT nasal spray Place 2 sprays into both nostrils daily. 18.2 g Rodriguez-Southworth, Nettie Elm, PA-C      PDMP not reviewed this encounter.   Garey Ham, PA-C 09/25/20 1102

## 2020-10-04 ENCOUNTER — Ambulatory Visit
Admit: 2020-10-04 | Discharge: 2020-10-04 | Payer: PRIVATE HEALTH INSURANCE | Attending: Student in an Organized Health Care Education/Training Program | Primary: Student in an Organized Health Care Education/Training Program

## 2020-10-04 ENCOUNTER — Institutional Professional Consult (permissible substitution)
Admit: 2020-10-04 | Discharge: 2020-10-04 | Payer: PRIVATE HEALTH INSURANCE | Attending: Audiologist | Primary: Audiologist

## 2020-10-04 DIAGNOSIS — H9319 Tinnitus, unspecified ear: Principal | ICD-10-CM

## 2020-10-20 DIAGNOSIS — H9319 Tinnitus, unspecified ear: Principal | ICD-10-CM

## 2020-11-03 ENCOUNTER — Institutional Professional Consult (permissible substitution)
Admit: 2020-11-03 | Discharge: 2020-11-04 | Payer: PRIVATE HEALTH INSURANCE | Attending: Student in an Organized Health Care Education/Training Program | Primary: Student in an Organized Health Care Education/Training Program

## 2020-11-04 ENCOUNTER — Ambulatory Visit
Admit: 2020-11-04 | Discharge: 2020-11-05 | Payer: PRIVATE HEALTH INSURANCE | Attending: Student in an Organized Health Care Education/Training Program | Primary: Student in an Organized Health Care Education/Training Program

## 2020-11-04 DIAGNOSIS — M26623 Arthralgia of bilateral temporomandibular joint: Principal | ICD-10-CM

## 2020-11-04 DIAGNOSIS — R42 Dizziness and giddiness: Principal | ICD-10-CM

## 2020-11-04 DIAGNOSIS — H6983 Other specified disorders of Eustachian tube, bilateral: Principal | ICD-10-CM

## 2020-11-04 DIAGNOSIS — H9313 Tinnitus, bilateral: Principal | ICD-10-CM

## 2020-11-13 ENCOUNTER — Ambulatory Visit
Admission: EM | Admit: 2020-11-13 | Discharge: 2020-11-13 | Disposition: A | Discharge status: Home / Self Care | Payer: BC Managed Care – PPO | Attending: Emergency Medicine | Admitting: Emergency Medicine

## 2020-11-13 ENCOUNTER — Encounter: Payer: Self-pay | Admitting: Emergency Medicine

## 2020-11-13 ENCOUNTER — Other Ambulatory Visit: Payer: Self-pay

## 2020-11-13 DIAGNOSIS — B084 Enteroviral vesicular stomatitis with exanthem: Secondary | ICD-10-CM

## 2020-11-13 MED ORDER — LIDOCAINE VISCOUS HCL 2 % MT SOLN: 15.0000 mL | OROMUCOSAL | 0 refills | Status: DC | PRN

## 2020-11-13 NOTE — ED Triage Notes (Signed)
Patient states that she noticed a tender bump on the roof of her mouth on Wed.  Patient states since then her pain has worsen and has had swelling in her mouth.  Patient denies fevers.

## 2020-11-13 NOTE — Discharge Instructions (Signed)
Use the viscose lidocaine, 15 mL swish, gargle and spit, 15 minutes before meals as needed for sore throat.  Gargle with warm salt water 2-3 times a day to soothe your throat, aid in pain relief, and aid in healing.  Take over-the-counter ibuprofen according to the package instructions as needed for pain.  You can also use Chloraseptic or Sucrets lozenges, 1 lozenge every 2 hours as needed for throat pain.  If you develop any new or worsening symptoms return for reevaluation.

## 2020-11-13 NOTE — ED Provider Notes (Signed)
[facility]    CSN: [id number] Arrival date & time: 11/13/20  1700      History   Chief Complaint Chief Complaint  Patient presents with   Mouth Lesions   Oral Swelling    HPI Abigail Hale is a 25 y.o. female.   HPI  25 year old female here for evaluation of mouth lesion.  Patient reports that she developed a lesion on the roof of her mouth 4 days ago.  Since then the area has continued to swell and become more painful.  She states that her biggest issue is with swallowing.  She is also experiencing a mild, intermittent nonproductive cough.  She denies fever, runny nose nasal congestion, ear pain, or rashes.  She denies any contact or exposure with small children.  Past Medical History:  Diagnosis Date   Depression    Fatty liver    Hypertension    Insomnia    PCOS (polycystic ovarian syndrome)     Patient Active Problem List   Diagnosis Date Noted   Dyspepsia    Abnormal CT scan, gastrointestinal tract    Fatty liver disease, nonalcoholic 04/05/2020   PCOS (polycystic ovarian syndrome) 07/07/2014   Hydradenitis 07/07/2006    Past Surgical History:  Procedure Laterality Date   COLONOSCOPY WITH PROPOFOL N/A 09/22/2020   Procedure: COLONOSCOPY WITH PROPOFOL;  Surgeon: Toney Reil, MD;  Location: [facility];  Service: Endoscopy;  Laterality: N/A;   ESOPHAGOGASTRODUODENOSCOPY (EGD) WITH PROPOFOL N/A 09/22/2020   Procedure: ESOPHAGOGASTRODUODENOSCOPY (EGD) WITH PROPOFOL;  Surgeon: Toney Reil, MD;  Location: [facility];  Service: Endoscopy;  Laterality: N/A;   TONSILLECTOMY      OB History   No obstetric history on file.      Home Medications    Prior to Admission medications   Medication Sig Start Date End Date Taking? Authorizing Provider  fluticasone (FLONASE) 50 MCG/ACT nasal spray Place 2 sprays into both nostrils daily. 09/25/20  Yes Rodriguez-Southworth, Nettie Elm, PA-C  lidocaine (XYLOCAINE) 2 % solution Use  as directed 15 mLs in the mouth or throat as needed for mouth pain. 11/13/20  Yes Becky Augusta, NP  metFORMIN (GLUCOPHAGE) 500 MG tablet Take by mouth daily with breakfast. For PCOS   Yes [provider]  omeprazole (PRILOSEC) 40 MG capsule Take by mouth. 07/21/20  Yes [provider]    Family History Family History  Problem Relation Age of Onset   Healthy Mother    Diabetes Father     Social History Social History   Tobacco Use   Smoking status: Never   Smokeless tobacco: Never  Vaping Use   Vaping Use: Never used  Substance Use Topics   Alcohol use: Yes    Comment: socially   Drug use: Never     Allergies   Amoxicillin, Levaquin [levofloxacin], and Penicillins   Review of Systems Review of Systems  Constitutional:  Negative for activity change, appetite change and fever.  HENT:  Positive for mouth sores. Negative for congestion, ear pain and sore throat.   Respiratory:  Positive for cough. Negative for shortness of breath and wheezing.   Gastrointestinal:  Negative for diarrhea, nausea and vomiting.  Skin:  Negative for rash.  Hematological: Negative.   Psychiatric/Behavioral: Negative.      Physical Exam Triage Vital Signs ED Triage Vitals  Enc Vitals Group     BP 11/13/20 0958 130/82     Pulse Rate 11/13/20 0958 89     Resp 11/13/20  0958 15     Temp 11/13/20 0958 98.3 F (36.8 C)     Temp Source 11/13/20 0958 Oral     SpO2 11/13/20 0958 99 %     Weight 11/13/20 0955 290 lb (131.5 kg)     Height 11/13/20 0955 5\' 6"  (1.676 m)     Head Circumference --      Peak Flow --      Pain Score 11/13/20 0954 7     Pain Loc --      Pain Edu? --      Excl. in [place]? --    No data found.  Updated Vital Signs BP 130/82 (BP Location: Right Arm)   Pulse 89   Temp 98.3 F (36.8 C) (Oral)   Resp 15   Ht 5\' 6"  (1.676 m)   Wt 290 lb (131.5 kg)   LMP 10/16/2020 (Approximate)   SpO2 99%   BMI 46.81 kg/m   Visual Acuity Right Eye Distance:    Left Eye Distance:   Bilateral Distance:    Right Eye Near:   Left Eye Near:    Bilateral Near:     Physical Exam Vitals and nursing note reviewed.  Constitutional:      General: She is not in acute distress.    Appearance: Normal appearance. She is not ill-appearing.  HENT:     Head: Normocephalic and atraumatic.     Right Ear: Tympanic membrane, ear canal and external ear normal. There is no impacted cerumen.     Left Ear: Tympanic membrane, ear canal and external ear normal. There is no impacted cerumen.     Nose: Nose normal.     Mouth/Throat:     Pharynx: Posterior oropharyngeal erythema present.  Cardiovascular:     Rate and Rhythm: Normal rate and regular rhythm.     Pulses: Normal pulses.     Heart sounds: Normal heart sounds. No murmur heard.   No gallop.  Pulmonary:     Effort: Pulmonary effort is normal.     Breath sounds: Normal breath sounds. No wheezing, rhonchi or rales.  Musculoskeletal:     Cervical back: Normal range of motion and neck supple.  Lymphadenopathy:     Cervical: No cervical adenopathy.  Skin:    General: Skin is warm and dry.     Capillary Refill: Capillary refill takes less than 2 seconds.     Findings: No erythema or rash.  Neurological:     General: No focal deficit present.     Mental Status: She is alert and oriented to person, place, and time.  Psychiatric:        Mood and Affect: Mood normal.        Behavior: Behavior normal.        Thought Content: Thought content normal.        Judgment: Judgment normal.     UC Treatments / Results  Labs (all labs ordered are listed, but only abnormal results are displayed) Labs Reviewed - No data to display  EKG   Radiology No results found.  Procedures Procedures (including critical care time)  Medications Ordered in UC Medications - No data to display  Initial Impression / Assessment and Plan / UC Course  I have reviewed the triage vital signs and the nursing  notes.  Pertinent labs & imaging results that were available during my care of the patient were reviewed by me and considered in my medical decision making (see chart for details).  Is a nontoxic-appearing 25 year old female here for evaluation of mouth lesion that she noticed 4 days ago.  This not associate with any other upper respiratory symptoms or fever.  Patient denies any contact with small children and denies that she has share food or beverages with anybody else.  Patient's physical exam reveals protegrin tympanic membranes bilaterally with normal light reflex and clear external auditory canals.  Nasal mucosa is erythematous and mildly edematous without significant discharge.  Oropharyngeal exam reveals the presence of 3 small ulcerative lesions on the patient's uvula with a fourth lesion just left and lateral of the uvula on the soft palate.  Posterior oropharynx has mild erythema and injection.  No cervical lymphadenopathy appreciated exam.  Cardiopulmonary exam is benign.  Suspect patient has hand-foot-and-mouth given her clinical presentation.  There are no lesions on patient's hands or feet.  We will treat with supportive care to include Sucrets lozenges and Chloraseptic.  We will also give viscous lidocaine for patient to swish, gargle, and spit prior to meals.  Patient advised to return for new or worsening symptoms.  Work note provided.   Final Clinical Impressions(s) / UC Diagnoses   Final diagnoses:  Hand, foot and mouth disease     Discharge Instructions      Use the viscose lidocaine, 15 mL swish, gargle and spit, 15 minutes before meals as needed for sore throat.  Gargle with warm salt water 2-3 times a day to soothe your throat, aid in pain relief, and aid in healing.  Take over-the-counter ibuprofen according to the package instructions as needed for pain.  You can also use Chloraseptic or Sucrets lozenges, 1 lozenge every 2 hours as needed for throat pain.  If you  develop any new or worsening symptoms return for reevaluation.      ED Prescriptions     Medication Sig Dispense Auth. Provider   lidocaine (XYLOCAINE) 2 % solution Use as directed 15 mLs in the mouth or throat as needed for mouth pain. 100 mL Becky Augusta, NP      PDMP not reviewed this encounter.   Becky Augusta, NP 11/13/20 1038

## 2020-11-14 ENCOUNTER — Ambulatory Visit: Payer: BC Managed Care – PPO

## 2020-12-01 ENCOUNTER — Other Ambulatory Visit: Payer: Self-pay

## 2020-12-01 ENCOUNTER — Encounter: Payer: Self-pay | Admitting: Gastroenterology

## 2020-12-01 ENCOUNTER — Ambulatory Visit (INDEPENDENT_AMBULATORY_CARE_PROVIDER_SITE_OTHER): Payer: BC Managed Care – PPO | Admitting: Gastroenterology

## 2020-12-01 VITALS — BP 128/84 | HR 97 | Temp 97.8°F | Ht 66.0 in | Wt 288.0 lb

## 2020-12-01 DIAGNOSIS — G8929 Other chronic pain: Secondary | ICD-10-CM

## 2020-12-01 DIAGNOSIS — R1011 Right upper quadrant pain: Secondary | ICD-10-CM | POA: Diagnosis not present

## 2020-12-01 DIAGNOSIS — R1013 Epigastric pain: Secondary | ICD-10-CM | POA: Diagnosis not present

## 2020-12-01 MED ORDER — OMEPRAZOLE 20 MG PO CPDR: 20.0000 mg | DELAYED_RELEASE_CAPSULE | Freq: Every day | ORAL | 2 refills | Status: AC

## 2020-12-01 NOTE — Progress Notes (Signed)
Abigail Repress, MD [address]  [address]  [place], [place] [address]  Main: [phone]  Fax: [phone]    Gastroenterology Consultation  Referring Provider:     [name], NP Primary Care Physician:  [name], NP Primary Gastroenterologist:  Dr. Arlyss Hale Reason for Consultation: Abdominal pain, abdominal bloating        HPI:   Abigail Hale is a 25 y.o. female referred by Dr. [name], NP  for consultation & management of abdominal pain and bloating.  Patient went to ER in April 2022 at [facility] for acute episode of upper abdominal pain associated with burning in the stomach as well as burning in the chest, nausea and abdominal bloating.  She underwent CT abdomen and pelvis which revealed nonspecific thickening of the ascending colon and cecum.  Ultrasound abdomen did not reveal any cholelithiasis but the gallbladder was physiologically distended.  She has been started on omeprazole 40 mg once daily which helps with her burning symptoms.  However, she continues to have ongoing upper abdominal discomfort.  She denies any change in her bowel habits.  She denies any rectal bleeding.  She does have history of hidradenitis suppurativa since age of 81.  She tried Humira, unfortunately developed hives and therefore discontinued.  Patient has been on several rounds of antibiotics for last 10 to 15 years.  Patient reports that she underwent H. pylori breath test and it was negative, this was about a month ago. Patient has history of PCOS, she stopped taking metformin by herself   Follow-up visit 12/01/2020 Patient is here to discuss about her right upper quadrant discomfort.  She underwent upper endoscopy and colonoscopy which were all unremarkable.  Patient resumed her metformin.  She stopped omeprazole about a week ago as she was concerned about the side effects.  That has resulted in recurrence of right upper quadrant discomfort, describes it as cramping, radiating  to right lower back, no particular association with food, intermittent, not associated with nausea or vomiting.  Ultrasound revealed distended gallbladder.  She reports that her bowel movements are not regular and she denies abdominal bloating.  Overall, her symptoms have improved except for right upper quadrant discomfort.   NSAIDs: None  Antiplts/Anticoagulants/Anti thrombotics: None  GI Procedures:  EGD and colonoscopy 09/22/2020 - Normal duodenal bulb and second portion of the duodenum. Biopsied. - Normal stomach. Biopsied. - Esophagogastric landmarks identified. - Normal gastroesophageal junction and esophagus. Biopsied.  - The examined portion of the ileum was normal. Biopsied. - The entire examined colon is normal. Biopsied. - The distal rectum and anal verge are normal on retroflexion view. DIAGNOSIS:  A. DUODENUM; BIOPSY:  - DUODENAL MUCOSA WITH NO SIGNIFICANT PATHOLOGIC ALTERATION.  - NEGATIVE FOR FEATURES OF CELIAC DISEASE.  - NEGATIVE FOR DYSPLASIA AND MALIGNANCY.   B. STOMACH; BIOPSY:  - ANTRAL AND OXYNTIC MUCOSA WITH NO SIGNIFICANT PATHOLOGIC ALTERATION.  - NEGATIVE FOR ACTIVE INFLAMMATION AND H PYLORI.  - NEGATIVE FOR INTESTINAL METAPLASIA, DYSPLASIA, AND MALIGNANCY.   C. ESOPHAGUS, RANDOM; BIOPSY:  - SQUAMOUS MUCOSA WITH NO SIGNIFICANT PATHOLOGIC ALTERATION.  - NEGATIVE FOR INCREASED EOSINOPHILS.  - NEGATIVE FOR INTESTINAL METAPLASIA, DYSPLASIA, AND MALIGNANCY.   D. TERMINAL ILEUM; BIOPSY:  - ENTERIC MUCOSA WITH NO SIGNIFICANT PATHOLOGIC ALTERATION.  - NEGATIVE FOR ACTIVE INFLAMMATION AND FEATURES OF CHRONICITY.  - NEGATIVE FOR DYSPLASIA AND MALIGNANCY.   E. COLON, RANDOM; BIOPSY:  - COLONIC MUCOSA WITH NO SIGNIFICANT PATHOLOGIC ALTERATION.  - NEGATIVE FOR ACTIVE INFLAMMATION  AND FEATURES OF CHRONICITY.  - NEGATIVE FOR MICROSCOPIC COLITIS, DYSPLASIA, AND MALIGNANCY.  She denies family history of GI malignancy  Past Medical History:  Diagnosis Date    Depression    Fatty liver    Hypertension    Insomnia    PCOS (polycystic ovarian syndrome)     Past Surgical History:  Procedure Laterality Date   COLONOSCOPY WITH PROPOFOL N/A 09/22/2020   Procedure: COLONOSCOPY WITH PROPOFOL;  Surgeon: Toney Reil, MD;  Location: [facility];  Service: Endoscopy;  Laterality: N/A;   ESOPHAGOGASTRODUODENOSCOPY (EGD) WITH PROPOFOL N/A 09/22/2020   Procedure: ESOPHAGOGASTRODUODENOSCOPY (EGD) WITH PROPOFOL;  Surgeon: Toney Reil, MD;  Location: [facility];  Service: Endoscopy;  Laterality: N/A;   TONSILLECTOMY      Current Outpatient Medications:    fluticasone (FLONASE) 50 MCG/ACT nasal spray, Place 2 sprays into both nostrils daily., Disp: 18.2 g, Rfl: 0   metFORMIN (GLUCOPHAGE) 500 MG tablet, Take by mouth daily with breakfast. For PCOS, Disp: , Rfl:    omeprazole (PRILOSEC) 20 MG capsule, Take 1 capsule (20 mg total) by mouth daily before breakfast., Disp: 30 capsule, Rfl: 2   lidocaine (XYLOCAINE) 2 % solution, Use as directed 15 mLs in the mouth or throat as needed for mouth pain. (Patient not taking: Reported on 12/01/2020), Disp: 100 mL, Rfl: 0   Family History  Problem Relation Age of Onset   Healthy Mother    Diabetes Father      Social History   Tobacco Use   Smoking status: Never   Smokeless tobacco: Never  Vaping Use   Vaping Use: Never used  Substance Use Topics   Alcohol use: Not Currently    Comment: socially   Drug use: Never    Allergies as of 12/01/2020 - Review Complete 11/13/2020  Allergen Reaction Noted   Amoxicillin Hives 08/15/2017   Humira [adalimumab] Hives 12/01/2020   Levaquin [levofloxacin] Itching 09/25/2020   Penicillins Hives 08/15/2017    Review of Systems:    All systems reviewed and negative except where noted in HPI.   Physical Exam:  BP 128/84 (BP Location: Left Arm, Patient Position: Sitting, Cuff Size: Large)   Pulse 97   Temp 97.8 F (36.6 C) (Temporal)    Ht 5\' 6"  (1.676 m)   Wt 288 lb (130.6 kg)   BMI 46.48 kg/m  No LMP recorded.  General:   Alert,  Well-developed, well-nourished, pleasant and cooperative in NAD Head:  Normocephalic and atraumatic. Eyes:  Sclera clear, no icterus.   Conjunctiva pink. Ears:  Normal auditory acuity. Nose:  No deformity, discharge, or lesions. Mouth:  No deformity or lesions,oropharynx pink & moist. Neck:  Supple; no masses or thyromegaly. Lungs:  Respirations even and unlabored.  Clear throughout to auscultation.   No wheezes, crackles, or rhonchi. No acute distress. Heart:  Regular rate and rhythm; no murmurs, clicks, rubs, or gallops. Abdomen:  Normal bowel sounds. Soft, obese, mild right upper quadrant tenderness and non-distended without masses, hepatosplenomegaly or hernias noted.  No guarding or rebound tenderness.   Rectal: Not performed Msk:  Symmetrical without gross deformities. Good, equal movement & strength bilaterally. Pulses:  Normal pulses noted. Extremities:  No clubbing or edema.  No cyanosis. Neurologic:  Alert and oriented x3;  grossly normal neurologically. Skin:  Intact without significant lesions or rashes. No jaundice. Psych:  Alert and cooperative. Normal mood and affect.  Imaging Studies: Reviewed  Assessment and Plan:   HILARI WETHINGTON is a  25 y.o. Caucasian female with history of PCOS on metformin, fatty liver, hidradenitis suppurativa is seen in consultation for chronic symptoms of upper abdominal discomfort associated with abdominal bloating  Epigastric burning pain and heartburn Patient discontinued omeprazole 40 mg daily as she is concerned it was side effects Will try omeprazole 20 mg daily EGD unremarkable  Right upper quadrant discomfort Not typical presentation of biliary colic EGD negative for H. pylori Distended gallbladder on ultrasound, no evidence of cholelithiasis Will refer to general surgery to evaluate for any underlying gallbladder  etiology  Abdominal bloating: Relieved No need of antibiotics at this time No further work-up indicated at this time  Follow up as needed   Abigail Repress, MD

## 2020-12-01 NOTE — Progress Notes (Signed)
Referral to general surgery sent in

## 2020-12-15 ENCOUNTER — Ambulatory Visit: Payer: BC Managed Care – PPO | Admitting: Surgery

## 2021-01-03 ENCOUNTER — Ambulatory Visit
Admit: 2021-01-03 | Discharge: 2021-01-04 | Payer: PRIVATE HEALTH INSURANCE | Attending: Student in an Organized Health Care Education/Training Program | Primary: Student in an Organized Health Care Education/Training Program

## 2021-01-03 DIAGNOSIS — J029 Acute pharyngitis, unspecified: Secondary | ICD-10-CM | POA: Insufficient documentation

## 2021-01-05 ENCOUNTER — Ambulatory Visit
Admission: RE | Admit: 2021-01-05 | Discharge: 2021-01-05 | Disposition: A | Discharge status: Home / Self Care | Payer: BC Managed Care – PPO | Source: Ambulatory Visit | Attending: Internal Medicine | Admitting: Internal Medicine

## 2021-01-05 ENCOUNTER — Other Ambulatory Visit: Payer: Self-pay

## 2021-01-05 VITALS — BP 134/90 | HR 97 | Temp 98.4°F | Resp 16

## 2021-01-05 DIAGNOSIS — H6591 Unspecified nonsuppurative otitis media, right ear: Secondary | ICD-10-CM | POA: Diagnosis not present

## 2021-01-05 DIAGNOSIS — J040 Acute laryngitis: Secondary | ICD-10-CM

## 2021-01-05 MED ORDER — PSEUDOEPHEDRINE HCL ER 120 MG PO TB12: 120.0000 mg | ORAL_TABLET | Freq: Two times a day (BID) | ORAL | 0 refills | Status: DC

## 2021-01-05 MED ORDER — AZITHROMYCIN 250 MG PO TABS: ORAL_TABLET | ORAL | 0 refills | Status: DC

## 2021-01-05 NOTE — ED Provider Notes (Signed)
[facility]    CSN: [id number] Arrival date & time: 01/05/21  1827      History   Chief Complaint Chief Complaint  Patient presents with   Cough   Ear Fullness    HPI Abigail Hale is a 25 y.o. female who presents with R ear fullness and pain since yesterday and hoarseness. Has had cough and nose congestion with fever for the 1st 2 days 4 days ago. No fever since. She was doing saline nose rinses when this caused the ear pressure feeling on the R side. She has been using her Flonase qd    Past Medical History:  Diagnosis Date   Abnormal CT scan, gastrointestinal tract    Depression    Fatty liver    Hypertension    Insomnia    PCOS (polycystic ovarian syndrome)     Patient Active Problem List   Diagnosis Date Noted   Dyspepsia    Fatty liver disease, nonalcoholic [id number]   PCOS (polycystic ovarian syndrome) 07/07/2014   Hydradenitis 07/07/2006    Past Surgical History:  Procedure Laterality Date   COLONOSCOPY WITH PROPOFOL N/A 09/22/2020   Procedure: COLONOSCOPY WITH PROPOFOL;  Surgeon: Lin Landsman, MD;  Location: [place];  Service: Endoscopy;  Laterality: N/A;   ESOPHAGOGASTRODUODENOSCOPY (EGD) WITH PROPOFOL N/A 09/22/2020   Procedure: ESOPHAGOGASTRODUODENOSCOPY (EGD) WITH PROPOFOL;  Surgeon: Lin Landsman, MD;  Location: [place];  Service: Endoscopy;  Laterality: N/A;   TONSILLECTOMY      OB History   No obstetric history on file.      Home Medications    Prior to Admission medications   Medication Sig Start Date End Date Taking? Authorizing Provider  azithromycin (ZITHROMAX Z-PAK) 250 MG tablet Take 2 today then one qd x 4 days 01/05/21  Yes Rodriguez-Southworth, Sunday Spillers, PA-C  metFORMIN (GLUCOPHAGE) 500 MG tablet Take by mouth daily with breakfast. For PCOS   Yes [provider]  pseudoephedrine (SUDAFED 12 HOUR) 120 MG 12 hr tablet Take 1 tablet (120 mg total) by mouth 2 (two) times daily.  01/05/21  Yes Rodriguez-Southworth, Sunday Spillers, PA-C  fluticasone (FLONASE) 50 MCG/ACT nasal spray Place 2 sprays into both nostrils daily. 09/25/20   Rodriguez-Southworth, Sunday Spillers, PA-C  omeprazole (PRILOSEC) 20 MG capsule Take 1 capsule (20 mg total) by mouth daily before breakfast. 12/01/20 12/31/20  Vanga, Tally Due, MD    Family History Family History  Problem Relation Age of Onset   Healthy Mother    Diabetes Father     Social History Social History   Tobacco Use   Smoking status: Never   Smokeless tobacco: Never  Vaping Use   Vaping Use: Never used  Substance Use Topics   Alcohol use: Not Currently    Comment: socially   Drug use: Never     Allergies   Amoxicillin, Humira [adalimumab], Levaquin [levofloxacin], and Penicillins   Review of Systems Review of Systems  Constitutional:  Negative for appetite change, chills, diaphoresis, fatigue and fever.  HENT:  Positive for congestion, ear pain, postnasal drip and voice change. Negative for ear discharge and sore throat.   Respiratory:  Positive for cough.   Musculoskeletal:  Negative for myalgias.  Skin:  Negative for rash.  Neurological:  Negative for headaches.  Hematological:  Negative for adenopathy.    Physical Exam Triage Vital Signs ED Triage Vitals  Enc Vitals Group     BP 01/05/21 1856 134/90     Pulse Rate 01/05/21 1856 97  Resp 01/05/21 1856 16     Temp 01/05/21 1856 98.4 F (36.9 C)     Temp Source 01/05/21 1856 Oral     SpO2 01/05/21 1856 99 %     Weight --      Height --      Head Circumference --      Peak Flow --      Pain Score 01/05/21 1854 8     Pain Loc --      Pain Edu? --      Excl. in [place]? --    No data found.  Updated Vital Signs BP 134/90   Pulse 97   Temp 98.4 F (36.9 C) (Oral)   Resp 16   LMP 11/08/2020   SpO2 99%   Visual Acuity Right Eye Distance:   Left Eye Distance:   Bilateral Distance:    Right Eye Near:   Left Eye Near:    Bilateral Near:      Physical Exam Vitals and nursing note reviewed.  Constitutional:      General: She is not in acute distress.    Appearance: She is obese. She is not toxic-appearing.     Comments: She is horsed   HENT:     Right Ear: Ear canal and external ear normal. Tympanic membrane is erythematous. Tympanic membrane is not bulging.     Left Ear: Tympanic membrane, ear canal and external ear normal.     Nose: Congestion present.  Eyes:     General: No scleral icterus.    Conjunctiva/sclera: Conjunctivae normal.  Cardiovascular:     Rate and Rhythm: Normal rate and regular rhythm.     Heart sounds: No murmur heard. Pulmonary:     Effort: Pulmonary effort is normal.     Breath sounds: Normal breath sounds.  Musculoskeletal:        General: Normal range of motion.     Cervical back: Neck supple.  Skin:    General: Skin is warm and dry.     Findings: No rash.  Neurological:     Mental Status: She is alert and oriented to person, place, and time.     Gait: Gait normal.  Psychiatric:        Mood and Affect: Mood normal.        Behavior: Behavior normal.        Thought Content: Thought content normal.        Judgment: Judgment normal.     UC Treatments / Results  Labs (all labs ordered are listed, but only abnormal results are displayed) Labs Reviewed - No data to display  EKG   Radiology No results found.  Procedures Procedures (including critical care time)  Medications Ordered in UC Medications - No data to display  Initial Impression / Assessment and Plan / UC Course  I have reviewed the triage vital signs and the nursing notes. R OM Laryngitis URI I placed her on Zpack, and Sudafed. May continue with the Flonase. See instructions.    Final Clinical Impressions(s) / UC Diagnoses   Final diagnoses:  Laryngitis  Right otitis media with effusion     Discharge Instructions      Use the Flonase one spray each nostril twice a day tilting your head to the side you  spray to help get to the eustachian tubes for 7 days.     ED Prescriptions     Medication Sig Dispense Auth. Provider   azithromycin (ZITHROMAX Z-PAK) 250 MG  tablet Take 2 today then one qd x 4 days 6 tablet Rodriguez-Southworth, Nettie Elm, PA-C   pseudoephedrine (SUDAFED 12 HOUR) 120 MG 12 hr tablet Take 1 tablet (120 mg total) by mouth 2 (two) times daily. 14 tablet Rodriguez-Southworth, Nettie Elm, PA-C      PDMP not reviewed this encounter.   Garey Ham, Cordelia Poche 01/05/21 2003

## 2021-01-05 NOTE — ED Triage Notes (Signed)
Cough and congestion started Monday. Ear pain started yesterday, right ear.

## 2021-01-05 NOTE — Discharge Instructions (Signed)
Use the Flonase one spray each nostril twice a day tilting your head to the side you spray to help get to the eustachian tubes for 7 days.

## 2021-01-28 ENCOUNTER — Ambulatory Visit
Admission: RE | Admit: 2021-01-28 | Discharge: 2021-01-28 | Disposition: A | Discharge status: Home / Self Care | Payer: BC Managed Care – PPO | Source: Ambulatory Visit | Attending: Physician Assistant | Admitting: Physician Assistant

## 2021-01-28 ENCOUNTER — Other Ambulatory Visit: Payer: Self-pay

## 2021-01-28 VITALS — BP 135/85 | HR 99 | Temp 98.9°F | Resp 16

## 2021-01-28 DIAGNOSIS — J3489 Other specified disorders of nose and nasal sinuses: Secondary | ICD-10-CM | POA: Diagnosis not present

## 2021-01-28 DIAGNOSIS — J029 Acute pharyngitis, unspecified: Secondary | ICD-10-CM | POA: Diagnosis not present

## 2021-01-28 DIAGNOSIS — R051 Acute cough: Secondary | ICD-10-CM

## 2021-01-28 DIAGNOSIS — J069 Acute upper respiratory infection, unspecified: Secondary | ICD-10-CM | POA: Diagnosis not present

## 2021-01-28 LAB — GROUP A STREP BY PCR: Group A Strep by PCR: NOT DETECTED

## 2021-01-28 NOTE — ED Triage Notes (Signed)
Pt c/o cough, congestion, and ST x 3 days.

## 2021-01-28 NOTE — Discharge Instructions (Addendum)
Mucinex D or Sudafed, Flonase, Motrin  URI/COLD SYMPTOMS: Your exam today is consistent with a viral illness. Antibiotics are not indicated at this time. Use medications as directed, including cough syrup, nasal saline, and decongestants. Your symptoms should improve over the next few days and resolve within 7-10 days. Increase rest and fluids. F/u if symptoms worsen or predominate such as sore throat, ear pain, productive cough, shortness of breath, or if you develop high fevers or worsening fatigue over the next several days.

## 2021-01-28 NOTE — ED Provider Notes (Signed)
[facility]    CSN: [id number] Arrival date & time: 01/28/21  [id number]      History   Chief Complaint Chief Complaint  Patient presents with   Cough   Nasal Congestion   Sore Throat    HPI Abigail Hale is a 25 y.o. female presenting for 3-day history of cough, nasal congestion, sinus pressure and sore throat.  No fever.  Has been a little fatigued.  No breathing difficulty, vomiting or diarrhea.  No sick contacts or known exposure to COVID-19, influenza or strep throat.  Has taken multiple COVID test at home which been negative.  Patient has been taking Mucinex for symptoms and using Afrin.  No other complaints.  HPI  Past Medical History:  Diagnosis Date   Abnormal CT scan, gastrointestinal tract    Depression    Fatty liver    Hypertension    Insomnia    PCOS (polycystic ovarian syndrome)     Patient Active Problem List   Diagnosis Date Noted   Dyspepsia    Fatty liver disease, nonalcoholic [id number]   PCOS (polycystic ovarian syndrome) 07/07/2014   Hydradenitis 07/07/2006    Past Surgical History:  Procedure Laterality Date   COLONOSCOPY WITH PROPOFOL N/A 09/22/2020   Procedure: COLONOSCOPY WITH PROPOFOL;  Surgeon: Lin Landsman, MD;  Location: [place];  Service: Endoscopy;  Laterality: N/A;   ESOPHAGOGASTRODUODENOSCOPY (EGD) WITH PROPOFOL N/A 09/22/2020   Procedure: ESOPHAGOGASTRODUODENOSCOPY (EGD) WITH PROPOFOL;  Surgeon: Lin Landsman, MD;  Location: [place];  Service: Endoscopy;  Laterality: N/A;   TONSILLECTOMY      OB History   No obstetric history on file.      Home Medications    Prior to Admission medications   Medication Sig Start Date End Date Taking? Authorizing Provider  metFORMIN (GLUCOPHAGE) 500 MG tablet Take by mouth daily with breakfast. For PCOS   Yes [provider]  azithromycin (ZITHROMAX Z-PAK) 250 MG tablet Take 2 today then one qd x 4 days 01/05/21   Rodriguez-Southworth,  Sunday Spillers, PA-C  fluticasone (FLONASE) 50 MCG/ACT nasal spray Place 2 sprays into both nostrils daily. 09/25/20   Rodriguez-Southworth, Sunday Spillers, PA-C  omeprazole (PRILOSEC) 20 MG capsule Take 1 capsule (20 mg total) by mouth daily before breakfast. 12/01/20 12/31/20  Lin Landsman, MD  pseudoephedrine (SUDAFED 12 HOUR) 120 MG 12 hr tablet Take 1 tablet (120 mg total) by mouth 2 (two) times daily. 01/05/21   Rodriguez-Southworth, Sunday Spillers, PA-C    Family History Family History  Problem Relation Age of Onset   Healthy Mother    Diabetes Father     Social History Social History   Tobacco Use   Smoking status: Never   Smokeless tobacco: Never  Vaping Use   Vaping Use: Never used  Substance Use Topics   Alcohol use: Not Currently    Comment: socially   Drug use: Never     Allergies   Amoxicillin, Humira [adalimumab], Levaquin [levofloxacin], and Penicillins   Review of Systems Review of Systems  Constitutional:  Positive for fatigue. Negative for chills, diaphoresis and fever.  HENT:  Positive for congestion, rhinorrhea, sinus pressure, sinus pain and sore throat. Negative for ear pain.   Respiratory:  Positive for cough. Negative for shortness of breath.   Gastrointestinal:  Negative for abdominal pain, nausea and vomiting.  Musculoskeletal:  Negative for arthralgias and myalgias.  Skin:  Negative for rash.  Neurological:  Negative for weakness and headaches.  Hematological:  Negative  for adenopathy.    Physical Exam Triage Vital Signs ED Triage Vitals  Enc Vitals Group     BP 01/28/21 0911 135/85     Pulse Rate 01/28/21 0911 99     Resp 01/28/21 0911 16     Temp --      Temp src --      SpO2 01/28/21 0911 99 %     Weight --      Height --      Head Circumference --      Peak Flow --      Pain Score 01/28/21 0910 0     Pain Loc --      Pain Edu? --      Excl. in [place]? --    No data found.  Updated Vital Signs BP 135/85 (BP Location: Right Arm)    Pulse 99     Temp 98.9 F (37.2 C) (Oral)    Resp 16    SpO2 99%      Physical Exam Vitals and nursing note reviewed.  Constitutional:      General: She is not in acute distress.    Appearance: Normal appearance. She is well-developed. She is not ill-appearing or toxic-appearing.  HENT:     Head: Normocephalic and atraumatic.     Right Ear: Tympanic membrane, ear canal and external ear normal.     Left Ear: Tympanic membrane, ear canal and external ear normal.     Nose: Congestion present.     Mouth/Throat:     Mouth: Mucous membranes are moist.     Pharynx: Oropharynx is clear. Posterior oropharyngeal erythema present.  Eyes:     General: No scleral icterus.       Right eye: No discharge.        Left eye: No discharge.     Conjunctiva/sclera: Conjunctivae normal.  Cardiovascular:     Rate and Rhythm: Normal rate and regular rhythm.     Heart sounds: Normal heart sounds.  Pulmonary:     Effort: Pulmonary effort is normal. No respiratory distress.     Breath sounds: Normal breath sounds.  Musculoskeletal:     Cervical back: Neck supple.  Skin:    General: Skin is dry.  Neurological:     General: No focal deficit present.     Mental Status: She is alert. Mental status is at baseline.     Motor: No weakness.     Gait: Gait normal.  Psychiatric:        Mood and Affect: Mood normal.        Behavior: Behavior normal.        Thought Content: Thought content normal.     UC Treatments / Results  Labs (all labs ordered are listed, but only abnormal results are displayed) Labs Reviewed  GROUP A STREP BY PCR    EKG   Radiology No results found.  Procedures Procedures (including critical care time)  Medications Ordered in UC Medications - No data to display  Initial Impression / Assessment and Plan / UC Course  I have reviewed the triage vital signs and the nursing notes.  Pertinent labs & imaging results that were available during my care of the patient were reviewed by me  and considered in my medical decision making (see chart for details).  25 year old female presenting for 3-day history of cough, congestion, sinus pain/pressure and sore throat.  Vitals are normal and stable and patient is overall well-appearing.  On exam she  has nasal congestion and posterior pharyngeal erythema.  Chest clear auscultation heart regular rate and rhythm.  PCR strep test obtained. Negative.  Advised patient she has a viral upper respiratory infection that will take about 7 to 10 days to run its course.  Suggested switching to Mucinex D or Sudafed and Flonase as well as increasing rest of fluids, ibuprofen and Tylenol for pain.  Reviewed if not feeling any better after 10 days or symptoms worsen to return and antibiotics can be considered but too early in course and this is most likely viral.  Final Clinical Impressions(s) / UC Diagnoses   Final diagnoses:  Viral upper respiratory tract infection  Sore throat  Acute cough  Sinus pain     Discharge Instructions      Mucinex D or Sudafed, Flonase, Motrin  URI/COLD SYMPTOMS: Your exam today is consistent with a viral illness. Antibiotics are not indicated at this time. Use medications as directed, including cough syrup, nasal saline, and decongestants. Your symptoms should improve over the next few days and resolve within 7-10 days. Increase rest and fluids. F/u if symptoms worsen or predominate such as sore throat, ear pain, productive cough, shortness of breath, or if you develop high fevers or worsening fatigue over the next several days.       ED Prescriptions   None    PDMP not reviewed this encounter.   Danton Clap, PA-C 01/28/21 1023

## 2021-03-07 ENCOUNTER — Institutional Professional Consult (permissible substitution)
Admit: 2021-03-07 | Discharge: 2021-03-08 | Payer: PRIVATE HEALTH INSURANCE | Attending: Audiologist | Primary: Audiologist

## 2021-03-07 ENCOUNTER — Ambulatory Visit: Admit: 2021-03-07 | Discharge: 2021-03-08 | Payer: PRIVATE HEALTH INSURANCE

## 2021-03-07 MED ORDER — AZELASTINE 137 MCG (0.1 %) NASAL SPRAY AEROSOL
Freq: Two times a day (BID) | NASAL | 11 refills | 30 days | Status: CP
Start: 2021-03-07 — End: 2022-03-07

## 2021-03-25 ENCOUNTER — Ambulatory Visit: Payer: BC Managed Care – PPO

## 2021-04-14 ENCOUNTER — Ambulatory Visit: Admit: 2021-04-14 | Discharge: 2021-04-15 | Payer: PRIVATE HEALTH INSURANCE

## 2021-11-13 ENCOUNTER — Ambulatory Visit
Admission: EM | Admit: 2021-11-13 | Discharge: 2021-11-13 | Disposition: A | Discharge status: Home / Self Care | Payer: BC Managed Care – PPO

## 2021-11-13 ENCOUNTER — Encounter: Payer: Self-pay | Admitting: Emergency Medicine

## 2021-11-13 DIAGNOSIS — J019 Acute sinusitis, unspecified: Secondary | ICD-10-CM | POA: Diagnosis not present

## 2021-11-13 DIAGNOSIS — J209 Acute bronchitis, unspecified: Secondary | ICD-10-CM | POA: Diagnosis not present

## 2021-11-13 MED ORDER — DOXYCYCLINE HYCLATE 100 MG PO CAPS: 100.0000 mg | ORAL_CAPSULE | Freq: Two times a day (BID) | ORAL | 0 refills | Status: AC

## 2021-11-13 NOTE — ED Triage Notes (Signed)
Pt c/o cough, nasal congestion, chest congestion. Started about a month and a half ago. She states she has had a "low grade fever of 99 pretty constantly"

## 2021-11-13 NOTE — ED Provider Notes (Signed)
[facility]    CSN: [id number] Arrival date & time: 11/13/21  1420      History   Chief Complaint Chief Complaint  Patient presents with   Cough    HPI Abigail Hale is a 26 y.o. female presenting for approximately 1 month history of nasal congestion, sinus pressure, cough and chest congestion.  Patient also reports a low-grade temp of 99 degrees over this time.  She says that she started to feel better initially and then her symptoms worsened over the past 5 days.  Reports that the cough is the most bothersome.  She says it is mostly nonproductive.  She denies any high-grade fever and has not had any chest pain or shortness of breath.  No report of any sick contacts.  Has been using her nasal spray but not taking any decongestants as she has been told to avoid phenylephrine with the fluoxetine medication that she takes daily.  No other concerns.  HPI  Past Medical History:  Diagnosis Date   Abnormal CT scan, gastrointestinal tract    Depression    Fatty liver    Hypertension    Insomnia    PCOS (polycystic ovarian syndrome)     Patient Active Problem List   Diagnosis Date Noted   Dyspepsia    Fatty liver disease, nonalcoholic 24/23/5361   PCOS (polycystic ovarian syndrome) 07/07/2014   Hydradenitis 07/07/2006    Past Surgical History:  Procedure Laterality Date   COLONOSCOPY WITH PROPOFOL N/A 09/22/2020   Procedure: COLONOSCOPY WITH PROPOFOL;  Surgeon: Lin Landsman, MD;  Location: [place];  Service: Endoscopy;  Laterality: N/A;   ESOPHAGOGASTRODUODENOSCOPY (EGD) WITH PROPOFOL N/A 09/22/2020   Procedure: ESOPHAGOGASTRODUODENOSCOPY (EGD) WITH PROPOFOL;  Surgeon: Lin Landsman, MD;  Location: [place];  Service: Endoscopy;  Laterality: N/A;   TONSILLECTOMY      OB History   No obstetric history on file.      Home Medications    Prior to Admission medications   Medication Sig Start Date End Date Taking? Authorizing  Provider  doxycycline (VIBRAMYCIN) 100 MG capsule Take 1 capsule (100 mg total) by mouth 2 (two) times daily for 7 days. 11/13/21 11/20/21 Yes Danton Clap, PA-C  FLUoxetine (PROZAC) 10 MG capsule Take 10-20 mg by mouth daily. 10/29/21  Yes [provider]  LORazepam (ATIVAN) 0.5 MG tablet Take 0.5 mg by mouth daily as needed. 08/23/21  Yes [provider]  fluticasone (FLONASE) 50 MCG/ACT nasal spray Place 2 sprays into both nostrils daily. 09/25/20   Rodriguez-Southworth, Sunday Spillers, PA-C  metFORMIN (GLUCOPHAGE) 500 MG tablet Take by mouth daily with breakfast. For PCOS    [provider]  omeprazole (PRILOSEC) 20 MG capsule Take 1 capsule (20 mg total) by mouth daily before breakfast. 12/01/20 12/31/20  Vanga, Tally Due, MD    Family History Family History  Problem Relation Age of Onset   Healthy Mother    Diabetes Father     Social History Social History   Tobacco Use   Smoking status: Never   Smokeless tobacco: Never  Vaping Use   Vaping Use: Never used  Substance Use Topics   Alcohol use: Not Currently    Comment: socially   Drug use: Never     Allergies   Amoxicillin, Humira [adalimumab], Levaquin [levofloxacin], and Penicillins   Review of Systems Review of Systems  Constitutional:  Positive for fatigue. Negative for chills, diaphoresis and fever.  HENT:  Positive for congestion and  sinus pressure. Negative for ear pain, rhinorrhea and sore throat.   Respiratory:  Positive for cough. Negative for shortness of breath.   Cardiovascular:  Negative for chest pain.  Gastrointestinal:  Negative for abdominal pain, nausea and vomiting.  Musculoskeletal:  Negative for arthralgias and myalgias.  Skin:  Negative for rash.  Neurological:  Negative for weakness and headaches.  Hematological:  Negative for adenopathy.     Physical Exam Triage Vital Signs ED Triage Vitals  Enc Vitals Group     BP      Pulse      Resp      Temp      Temp src       SpO2      Weight      Height      Head Circumference      Peak Flow      Pain Score      Pain Loc      Pain Edu?      Excl. in [place]?    No data found.  Updated Vital Signs BP (!) 146/88 (BP Location: Left Arm)   Pulse 95   Temp 98.8 F (37.1 C) (Oral)   Resp 18   Ht 5\' 6"  (1.676 m)   Wt 287 lb 14.7 oz (130.6 kg)   LMP 10/02/2021 (Approximate)   SpO2 99%   BMI 46.47 kg/m   Physical Exam Vitals and nursing note reviewed.  Constitutional:      General: She is not in acute distress.    Appearance: Normal appearance. She is ill-appearing. She is not toxic-appearing.  HENT:     Head: Normocephalic and atraumatic.     Nose: Congestion present.     Mouth/Throat:     Mouth: Mucous membranes are moist.     Pharynx: Oropharynx is clear.  Eyes:     General: No scleral icterus.       Right eye: No discharge.        Left eye: No discharge.     Conjunctiva/sclera: Conjunctivae normal.  Cardiovascular:     Rate and Rhythm: Normal rate and regular rhythm.     Heart sounds: Normal heart sounds.  Pulmonary:     Effort: Pulmonary effort is normal. No respiratory distress.     Breath sounds: Normal breath sounds. No wheezing, rhonchi or rales.  Musculoskeletal:     Cervical back: Neck supple.  Skin:    General: Skin is dry.  Neurological:     General: No focal deficit present.     Mental Status: She is alert. Mental status is at baseline.     Motor: No weakness.     Gait: Gait normal.  Psychiatric:        Mood and Affect: Mood normal.        Behavior: Behavior normal.        Thought Content: Thought content normal.      UC Treatments / Results  Labs (all labs ordered are listed, but only abnormal results are displayed) Labs Reviewed - No data to display  EKG   Radiology No results found.  Procedures Procedures (including critical care time)  Medications Ordered in UC Medications - No data to display  Initial Impression / Assessment and Plan / UC Course   I have reviewed the triage vital signs and the nursing notes.  Pertinent labs & imaging results that were available during my care of the patient were reviewed by me and considered in my medical decision  making (see chart for details).   26 year old female presenting for 1 month history of cough and congestion, sinus pressure.  Patient reports she started to feel better initially and then her symptoms moved into her chest over the past 5 days.  Denies fever or breathing occultly.  Vitals are stable.  She is afebrile.  She is mildly ill-appearing but nontoxic.  On exam she does have nasal congestion.  Throat is clear.  No wheezing or rhonchi noted upon auscultation of chest.  Suspect patient likely has secondary sinusitis/bronchitis.  We will treat at this time with antibiotics given how long her symptoms have been persistent.  We will send doxycycline as she has penicillin allergy listed.  Also advised to continue Flonase and start Robitussin, increase rest and fluids and return if fever, worsening cough/congestion or breathing difficulty.   Final Clinical Impressions(s) / UC Diagnoses   Final diagnoses:  Acute bronchitis, unspecified organism  Acute sinusitis, recurrence not specified, unspecified location     Discharge Instructions      -You have bronchitis and likely a sinus infection.  As we discussed, your symptoms still could all be due to a virus but given how long they have persisted, we will try an antibiotic to see if that will help.  I also suggested that he start over-the-counter Robitussin or Mucinex and increase rest and fluids.  Continue with the nasal spray. - Return if no improvement in the next 1 to 2 weeks or if you develop a fever, worsening cough or shortness of breath.     ED Prescriptions     Medication Sig Dispense Auth. Provider   doxycycline (VIBRAMYCIN) 100 MG capsule Take 1 capsule (100 mg total) by mouth 2 (two) times daily for 7 days. 14 capsule Danton Clap, PA-C      PDMP not reviewed this encounter.   Danton Clap, PA-C 11/13/21 1457

## 2021-11-13 NOTE — Discharge Instructions (Signed)
-  You have bronchitis and likely a sinus infection.  As we discussed, your symptoms still could all be due to a virus but given how long they have persisted, we will try an antibiotic to see if that will help.  I also suggested that he start over-the-counter Robitussin or Mucinex and increase rest and fluids.  Continue with the nasal spray. - Return if no improvement in the next 1 to 2 weeks or if you develop a fever, worsening cough or shortness of breath.

## 2022-01-10 ENCOUNTER — Ambulatory Visit
Admission: RE | Admit: 2022-01-10 | Discharge: 2022-01-10 | Disposition: A | Discharge status: Home / Self Care | Payer: BC Managed Care – PPO | Source: Ambulatory Visit | Attending: Family Medicine | Admitting: Family Medicine

## 2022-01-10 ENCOUNTER — Other Ambulatory Visit: Payer: Self-pay | Admitting: Family Medicine

## 2022-01-10 ENCOUNTER — Ambulatory Visit
Admission: RE | Admit: 2022-01-10 | Discharge: 2022-01-10 | Disposition: A | Discharge status: Home / Self Care | Payer: BC Managed Care – PPO | Attending: Family Medicine | Admitting: Family Medicine

## 2022-01-10 DIAGNOSIS — M25551 Pain in right hip: Secondary | ICD-10-CM | POA: Insufficient documentation

## 2022-03-05 ENCOUNTER — Ambulatory Visit
Admission: RE | Admit: 2022-03-05 | Discharge: 2022-03-05 | Disposition: A | Discharge status: Home / Self Care | Payer: BC Managed Care – PPO | Source: Ambulatory Visit | Attending: Physician Assistant | Admitting: Physician Assistant

## 2022-03-05 VITALS — BP 122/80 | HR 102 | Temp 98.7°F | Ht 66.0 in | Wt 285.0 lb

## 2022-03-05 DIAGNOSIS — J029 Acute pharyngitis, unspecified: Secondary | ICD-10-CM | POA: Diagnosis not present

## 2022-03-05 DIAGNOSIS — B338 Other specified viral diseases: Secondary | ICD-10-CM

## 2022-03-05 DIAGNOSIS — R509 Fever, unspecified: Secondary | ICD-10-CM | POA: Insufficient documentation

## 2022-03-05 DIAGNOSIS — Z1152 Encounter for screening for COVID-19: Secondary | ICD-10-CM | POA: Insufficient documentation

## 2022-03-05 DIAGNOSIS — R051 Acute cough: Secondary | ICD-10-CM | POA: Diagnosis present

## 2022-03-05 DIAGNOSIS — R0602 Shortness of breath: Secondary | ICD-10-CM | POA: Insufficient documentation

## 2022-03-05 DIAGNOSIS — B974 Respiratory syncytial virus as the cause of diseases classified elsewhere: Secondary | ICD-10-CM | POA: Insufficient documentation

## 2022-03-05 LAB — RESP PANEL BY RT-PCR (RSV, FLU A&B, COVID)  RVPGX2
Influenza A by PCR: NEGATIVE
Influenza B by PCR: NEGATIVE
Resp Syncytial Virus by PCR: POSITIVE — AB
SARS Coronavirus 2 by RT PCR: NEGATIVE

## 2022-03-05 LAB — GROUP A STREP BY PCR: Group A Strep by PCR: NOT DETECTED

## 2022-03-05 MED ORDER — PSEUDOEPH-BROMPHEN-DM 30-2-10 MG/5ML PO SYRP: 10.0000 mL | ORAL_SOLUTION | Freq: Four times a day (QID) | ORAL | 0 refills | Status: AC | PRN

## 2022-03-05 MED ORDER — ALBUTEROL SULFATE HFA 108 (90 BASE) MCG/ACT IN AERS: 1.0000 | INHALATION_SPRAY | Freq: Four times a day (QID) | RESPIRATORY_TRACT | 0 refills | Status: AC | PRN

## 2022-03-05 NOTE — ED Provider Notes (Signed)
[facility]    CSN: [id number] Arrival date & time: 03/05/22  1330      History   Chief Complaint Chief Complaint  Patient presents with   Cough   Fever   Sore Throat    HPI Abigail Hale is a 27 y.o. female presenting for feeling feverish with sore throat, cough, congestion, shortness of breath and burning sensation in her chest for the past 4 days.  She has not recorded a temperature.  She denies sinus pain, ear pain, chest pain.  No vomiting or diarrhea.  Patient says her roommate has a cough but does not seem as bad as sore.  She has been taking over-the-counter Mucinex but does not think is really helped.  She denies any history of respiratory problems.  Medical history is significant for hypertension and PCOS.  HPI  Past Medical History:  Diagnosis Date   Abnormal CT scan, gastrointestinal tract    Depression    Fatty liver    Hypertension    Insomnia    PCOS (polycystic ovarian syndrome)     Patient Active Problem List   Diagnosis Date Noted   Dyspepsia    Fatty liver disease, nonalcoholic 04/05/2020   PCOS (polycystic ovarian syndrome) 07/07/2014   Hydradenitis 07/07/2006    Past Surgical History:  Procedure Laterality Date   COLONOSCOPY WITH PROPOFOL N/A 09/22/2020   Procedure: COLONOSCOPY WITH PROPOFOL;  Surgeon: Toney Reil, MD;  Location: [facility];  Service: Endoscopy;  Laterality: N/A;   ESOPHAGOGASTRODUODENOSCOPY (EGD) WITH PROPOFOL N/A 09/22/2020   Procedure: ESOPHAGOGASTRODUODENOSCOPY (EGD) WITH PROPOFOL;  Surgeon: Toney Reil, MD;  Location: [facility];  Service: Endoscopy;  Laterality: N/A;   TONSILLECTOMY      OB History   No obstetric history on file.      Home Medications    Prior to Admission medications   Medication Sig Start Date End Date Taking? Authorizing Provider  albuterol (VENTOLIN HFA) 108 (90 Base) MCG/ACT inhaler Inhale 1-2 puffs into the lungs every 6 (six) hours as needed for  wheezing or shortness of breath. 03/05/22  Yes Eusebio Friendly B, PA-C  brompheniramine-pseudoephedrine-DM 30-2-10 MG/5ML syrup Take 10 mLs by mouth 4 (four) times daily as needed for up to 7 days. 03/05/22 03/12/22 Yes Shirlee Latch, PA-C  clonazePAM (KLONOPIN) 0.5 MG tablet Take 0.5 mg by mouth daily as needed. 02/27/22  Yes [provider]  FLUoxetine (PROZAC) 10 MG capsule Take 10-20 mg by mouth daily. 10/29/21  Yes [provider]  fluticasone (FLONASE) 50 MCG/ACT nasal spray Place 2 sprays into both nostrils daily. 09/25/20  Yes Rodriguez-Southworth, Nettie Elm, PA-C  metFORMIN (GLUCOPHAGE) 500 MG tablet Take by mouth daily with breakfast. For PCOS   Yes [provider]  LORazepam (ATIVAN) 0.5 MG tablet Take 0.5 mg by mouth daily as needed. 08/23/21   [provider]  omeprazole (PRILOSEC) 20 MG capsule Take 1 capsule (20 mg total) by mouth daily before breakfast. 12/01/20 12/31/20  Vanga, Loel Dubonnet, MD    Family History Family History  Problem Relation Age of Onset   Healthy Mother    Diabetes Father     Social History Social History   Tobacco Use   Smoking status: Never   Smokeless tobacco: Never  Vaping Use   Vaping Use: Never used  Substance Use Topics   Alcohol use: Not Currently    Comment: socially   Drug use: Never     Allergies   Adalimumab, Amoxicillin,  Levaquin [levofloxacin], and Penicillins   Review of Systems Review of Systems  Constitutional:  Positive for fatigue and fever. Negative for chills and diaphoresis.  HENT:  Positive for congestion, rhinorrhea and sore throat. Negative for ear pain, sinus pressure and sinus pain.   Respiratory:  Positive for cough and shortness of breath.   Gastrointestinal:  Negative for abdominal pain, nausea and vomiting.  Musculoskeletal:  Positive for myalgias.  Skin:  Negative for rash.  Neurological:  Positive for headaches. Negative for weakness.  Hematological:  Negative for adenopathy.      Physical Exam Triage Vital Signs ED Triage Vitals  Enc Vitals Group     BP 03/05/22 1417 122/80     Pulse Rate 03/05/22 1417 (!) 102     Resp --      Temp 03/05/22 1417 98.7 F (37.1 C)     Temp Source 03/05/22 1417 Oral     SpO2 03/05/22 1417 96 %     Weight 03/05/22 1416 285 lb (129.3 kg)     Height 03/05/22 1416 5\' 6"  (1.676 m)     Head Circumference --      Peak Flow --      Pain Score 03/05/22 1416 6     Pain Loc --      Pain Edu? --      Excl. in [place]? --    No data found.  Updated Vital Signs BP 122/80 (BP Location: Left Arm)   Pulse (!) 102   Temp 98.7 F (37.1 C) (Oral)   Ht 5\' 6"  (1.676 m)   Wt 285 lb (129.3 kg)   LMP 02/05/2022 (Exact Date)   SpO2 96%   BMI 46.00 kg/m       Physical Exam Vitals and nursing note reviewed.  Constitutional:      General: She is not in acute distress.    Appearance: Normal appearance. She is ill-appearing. She is not toxic-appearing.  HENT:     Head: Normocephalic and atraumatic.     Nose: Congestion present.     Mouth/Throat:     Mouth: Mucous membranes are moist.     Pharynx: Oropharynx is clear. Posterior oropharyngeal erythema present.  Eyes:     General: No scleral icterus.       Right eye: No discharge.        Left eye: No discharge.     Conjunctiva/sclera: Conjunctivae normal.  Cardiovascular:     Rate and Rhythm: Regular rhythm. Tachycardia present.     Heart sounds: Normal heart sounds.  Pulmonary:     Effort: Pulmonary effort is normal. No respiratory distress.     Breath sounds: Normal breath sounds.  Musculoskeletal:     Cervical back: Neck supple.  Skin:    General: Skin is dry.  Neurological:     General: No focal deficit present.     Mental Status: She is alert. Mental status is at baseline.     Motor: No weakness.     Gait: Gait normal.  Psychiatric:        Mood and Affect: Mood normal.        Behavior: Behavior normal.        Thought Content: Thought content normal.      UC  Treatments / Results  Labs (all labs ordered are listed, but only abnormal results are displayed) Labs Reviewed  RESP PANEL BY RT-PCR (RSV, FLU A&B, COVID)  RVPGX2 - Abnormal; Notable for the following components:  Result Value   Resp Syncytial Virus by PCR POSITIVE (*)    All other components within normal limits  GROUP A STREP BY PCR    EKG   Radiology No results found.  Procedures Procedures (including critical care time)  Medications Ordered in UC Medications - No data to display  Initial Impression / Assessment and Plan / UC Course  I have reviewed the triage vital signs and the nursing notes.  Pertinent labs & imaging results that were available during my care of the patient were reviewed by me and considered in my medical decision making (see chart for details).   27 year old female presents for fever, fatigue, cough, congestion, sore throat, shortness of breath, headaches and bodyaches for 4 days.  Patient is mildly ill-appearing but nontoxic.  Mild tachycardia, nasal congestion, erythema posterior pharynx, chest clear to auscultation.  Respiratory panel positive for RSV.  Discussed result with patient.  Patient should be low risk for any major problems due to RSV as she is 27 years old and otherwise healthy without any history of cardiopulmonary problems.  We discussed increasing rest and fluids and using Bromfed-DM which I prescribed.  She reports having some wheezing and shortness of breath when she lays down at night.  I sent inhaler for her to try.  Discussed how to use the inhaler.  Reviewed that she may be sick for couple of weeks but she should return if she has any acute worsening of her symptoms.  Work note given.   Final Clinical Impressions(s) / UC Diagnoses   Final diagnoses:  RSV infection  Acute cough  Sore throat  Shortness of breath     Discharge Instructions      URI/COLD SYMPTOMS: Your exam today is consistent with a viral illness.  Antibiotics are not indicated at this time. Use medications as directed, including cough syrup, nasal saline, and decongestants. Your symptoms should improve over the next few days and resolve within 1-3 weeks. Increase rest and fluids. F/u if symptoms worsen or predominate such as sore throat, ear pain, productive cough, shortness of breath, or if you develop high fevers or worsening fatigue over the next several days.      ED Prescriptions     Medication Sig Dispense Auth. Provider   brompheniramine-pseudoephedrine-DM 30-2-10 MG/5ML syrup Take 10 mLs by mouth 4 (four) times daily as needed for up to 7 days. 150 mL Laurene Footman B, PA-C   albuterol (VENTOLIN HFA) 108 (90 Base) MCG/ACT inhaler Inhale 1-2 puffs into the lungs every 6 (six) hours as needed for wheezing or shortness of breath. 1 g Danton Clap, PA-C      PDMP not reviewed this encounter.   Danton Clap, PA-C 03/05/22 [phone]

## 2022-03-05 NOTE — Discharge Instructions (Addendum)
URI/COLD SYMPTOMS: Your exam today is consistent with a viral illness. Antibiotics are not indicated at this time. Use medications as directed, including cough syrup, nasal saline, and decongestants. Your symptoms should improve over the next few days and resolve within 1-3 weeks. Increase rest and fluids. F/u if symptoms worsen or predominate such as sore throat, ear pain, productive cough, shortness of breath, or if you develop high fevers or worsening fatigue over the next several days.

## 2022-03-05 NOTE — ED Triage Notes (Signed)
Pt c/o intermittent fever, sore throat, cough, SOB, burning in chest onset x4 days ago.

## 2022-12-05 DIAGNOSIS — M25551 Pain in right hip: Principal | ICD-10-CM

## 2022-12-28 ENCOUNTER — Ambulatory Visit: Payer: BC Managed Care – PPO

## 2023-01-15 ENCOUNTER — Ambulatory Visit
Admission: RE | Admit: 2023-01-15 | Discharge: 2023-01-15 | Disposition: A | Discharge status: Home / Self Care | Payer: PRIVATE HEALTH INSURANCE | Source: Ambulatory Visit | Attending: Emergency Medicine | Admitting: Emergency Medicine

## 2023-01-15 VITALS — BP 123/82 | HR 91 | Temp 98.7°F | Resp 17

## 2023-01-15 DIAGNOSIS — J069 Acute upper respiratory infection, unspecified: Secondary | ICD-10-CM | POA: Diagnosis not present

## 2023-01-15 MED ORDER — BENZONATATE 100 MG PO CAPS: 200.0000 mg | ORAL_CAPSULE | Freq: Three times a day (TID) | ORAL | 0 refills | Status: AC

## 2023-01-15 MED ORDER — PROMETHAZINE-DM 6.25-15 MG/5ML PO SYRP: 5.0000 mL | ORAL_SOLUTION | Freq: Four times a day (QID) | ORAL | 0 refills | Status: AC | PRN

## 2023-01-15 MED ORDER — IPRATROPIUM BROMIDE 0.06 % NA SOLN: 2.0000 | Freq: Four times a day (QID) | NASAL | 12 refills | Status: AC

## 2023-01-15 MED ORDER — DOXYCYCLINE HYCLATE 100 MG PO CAPS: 100.0000 mg | ORAL_CAPSULE | Freq: Two times a day (BID) | ORAL | 0 refills | Status: AC

## 2023-01-15 NOTE — ED Triage Notes (Signed)
Patient states that her sx have been going on for a week and a half. Sore throat-left ear pain and nasal congestion.   She's been doing a sinus rinse. And Flonase and IBU

## 2023-01-15 NOTE — ED Provider Notes (Signed)
[facility]    CSN: [id number] Arrival date & time: 01/15/23  8657      History   Chief Complaint Chief Complaint  Patient presents with   Otalgia   Sore Throat   Nasal Congestion    HPI Abigail Hale is a 27 y.o. female.   HPI  27 year old female with a past medical history significant for insomnia, PCOS, hypertension, fatty liver, depression presents for evaluation of 1-1/2 weeks worth of nasal congestion, left ear pain, and sore throat.  She endorses some muffled hearing from her left ear as well as some mild dizziness.  Additionally, she has a nonproductive cough but no shortness breath or wheezing.  Also no fever, drainage from the ear, or nasal discharge.  Past Medical History:  Diagnosis Date   Abnormal CT scan, gastrointestinal tract    Depression    Fatty liver    Hypertension    Insomnia    PCOS (polycystic ovarian syndrome)     Patient Active Problem List   Diagnosis Date Noted   Dyspepsia    Fatty liver disease, nonalcoholic 04/05/2020   PCOS (polycystic ovarian syndrome) 07/07/2014   Hydradenitis 07/07/2006    Past Surgical History:  Procedure Laterality Date   COLONOSCOPY WITH PROPOFOL N/A 09/22/2020   Procedure: COLONOSCOPY WITH PROPOFOL;  Surgeon: Toney Reil, MD;  Location: [facility];  Service: Endoscopy;  Laterality: N/A;   ESOPHAGOGASTRODUODENOSCOPY (EGD) WITH PROPOFOL N/A 09/22/2020   Procedure: ESOPHAGOGASTRODUODENOSCOPY (EGD) WITH PROPOFOL;  Surgeon: Toney Reil, MD;  Location: [facility];  Service: Endoscopy;  Laterality: N/A;   TONSILLECTOMY      OB History   No obstetric history on file.      Home Medications    Prior to Admission medications   Medication Sig Start Date End Date Taking? Authorizing Provider  benzonatate (TESSALON) 100 MG capsule Take 2 capsules (200 mg total) by mouth every 8 (eight) hours. 01/15/23  Yes Becky Augusta, NP  doxycycline (VIBRAMYCIN) 100 MG capsule Take  1 capsule (100 mg total) by mouth 2 (two) times daily. 01/15/23  Yes Becky Augusta, NP  FLUoxetine (PROZAC) 10 MG capsule Take 10-20 mg by mouth daily. 10/29/21  Yes [provider]  ipratropium (ATROVENT) 0.06 % nasal spray Place 2 sprays into both nostrils 4 (four) times daily. 01/15/23  Yes Becky Augusta, NP  metFORMIN (GLUCOPHAGE) 500 MG tablet Take by mouth daily with breakfast. For PCOS   Yes [provider]  promethazine-dextromethorphan (PROMETHAZINE-DM) 6.25-15 MG/5ML syrup Take 5 mLs by mouth 4 (four) times daily as needed. 01/15/23  Yes Becky Augusta, NP  albuterol (VENTOLIN HFA) 108 (90 Base) MCG/ACT inhaler Inhale 1-2 puffs into the lungs every 6 (six) hours as needed for wheezing or shortness of breath. 03/05/22   Shirlee Latch, PA-C  clonazePAM (KLONOPIN) 0.5 MG tablet Take 0.5 mg by mouth daily as needed. 02/27/22   [provider]  fluticasone (FLONASE) 50 MCG/ACT nasal spray Place 2 sprays into both nostrils daily. 09/25/20   Rodriguez-Southworth, Nettie Elm, PA-C  LORazepam (ATIVAN) 0.5 MG tablet Take 0.5 mg by mouth daily as needed. 08/23/21   [provider]  omeprazole (PRILOSEC) 20 MG capsule Take 1 capsule (20 mg total) by mouth daily before breakfast. 12/01/20 12/31/20  Toney Reil, MD    Family History Family History  Problem Relation Age of Onset   Healthy Mother    Diabetes Father     Social History Social History   Tobacco  Use   Smoking status: Never   Smokeless tobacco: Never  Vaping Use   Vaping status: Never Used  Substance Use Topics   Alcohol use: Not Currently    Comment: socially   Drug use: Never     Allergies   Adalimumab, Amoxicillin, Levaquin [levofloxacin], and Penicillins   Review of Systems Review of Systems  Constitutional:  Negative for fever.  HENT:  Positive for congestion, ear pain and sore throat. Negative for rhinorrhea.   Respiratory:  Positive for cough. Negative for shortness of breath and  wheezing.      Physical Exam Triage Vital Signs ED Triage Vitals [01/15/23 0835]  Encounter Vitals Group     BP      Systolic BP Percentile      Diastolic BP Percentile      Pulse      Resp      Temp      Temp src      SpO2      Weight      Height      Head Circumference      Peak Flow      Pain Score 7     Pain Loc      Pain Education      Exclude from Growth Chart    No data found.  Updated Vital Signs BP 123/82 (BP Location: Right Arm)   Pulse 91   Temp 98.7 F (37.1 C) (Oral)   Resp 17   LMP 01/14/2023 (Exact Date)   SpO2 97%   Visual Acuity Right Eye Distance:   Left Eye Distance:   Bilateral Distance:    Right Eye Near:   Left Eye Near:    Bilateral Near:     Physical Exam Vitals and nursing note reviewed.  Constitutional:      Appearance: Normal appearance. She is not ill-appearing.  HENT:     Head: Normocephalic and atraumatic.     Right Ear: Tympanic membrane, ear canal and external ear normal. There is no impacted cerumen.     Left Ear: Ear canal and external ear normal. There is no impacted cerumen.     Ears:     Comments: Mild erythema to left TM.  No effusion noted.    Nose: Congestion and rhinorrhea present.     Comments: Nasal mucosa is erythematous and edematous with yellow discharge in both nares.    Mouth/Throat:     Mouth: Mucous membranes are moist.     Pharynx: Oropharynx is clear. Posterior oropharyngeal erythema present. No oropharyngeal exudate.     Comments: Mild erythema to the posterior oropharynx.  Tonsils are surgically absent. Cardiovascular:     Rate and Rhythm: Normal rate and regular rhythm.     Pulses: Normal pulses.     Heart sounds: Normal heart sounds. No murmur heard.    No friction rub. No gallop.  Pulmonary:     Effort: Pulmonary effort is normal.     Breath sounds: Normal breath sounds. No wheezing, rhonchi or rales.  Musculoskeletal:     Cervical back: Normal range of motion and neck supple.   Lymphadenopathy:     Cervical: No cervical adenopathy.  Skin:    General: Skin is warm and dry.     Capillary Refill: Capillary refill takes less than 2 seconds.     Findings: No rash.  Neurological:     General: No focal deficit present.     Mental Status: She is alert and oriented  to person, place, and time.      UC Treatments / Results  Labs (all labs ordered are listed, but only abnormal results are displayed) Labs Reviewed - No data to display  EKG   Radiology No results found.  Procedures Procedures (including critical care time)  Medications Ordered in UC Medications - No data to display  Initial Impression / Assessment and Plan / UC Course  I have reviewed the triage vital signs and the nursing notes.  Pertinent labs & imaging results that were available during my care of the patient were reviewed by me and considered in my medical decision making (see chart for details).   Patient is a nontoxic-appearing 27 year old female seen for evaluation of a week and a half worth of respiratory symptoms as outlined HPI above.  Her exam does reveal inflamed nasal mucosa with yellow nasal discharge and mild erythema to the posterior oropharynx.  There is also mild erythema to the left tympanic membrane without injection or effusion.  No cervical lymphadenopathy appreciated and cardiopulmonary exam is benign.  Patient's exam is consistent with an upper respiratory infection.  Given that she has had symptoms for a week and a half a trial of antibiotics is warranted.  She has a hive allergy to penicillins so I will not prescribe a cephalosporin.  I will put her on doxycycline 100 mg twice daily for 10 days for treatment of her URI along with Atrovent nasal spray, Tessalon Perles, Promethazine DM cough syrup for cough and congestion.  Return precautions reviewed.  Work note provided.   Final Clinical Impressions(s) / UC Diagnoses   Final diagnoses:  URI with cough and congestion      Discharge Instructions      The doxycycline twice daily with food for 10 days for treatment of your URI.  Perform sinus irrigation 2-3 times a day with a NeilMed sinus rinse kit and distilled water.  Do not use tap water.  You can use plain over-the-counter Mucinex every 6 hours to break up the stickiness of the mucus so your body can clear it.  Increase your oral fluid intake to thin out your mucus so that is also able for your body to clear more easily.  Take an over-the-counter probiotic, such as Culturelle-align-activia, 1 hour after each dose of antibiotic to prevent diarrhea.  Use the Atrovent nasal spray, 2 squirts in each nostril every 6 hours, as needed for runny nose and postnasal drip.  Use the Tessalon Perles every 8 hours during the day.  Take them with a small sip of water.  They may give you some numbness to the base of your tongue or a metallic taste in your mouth, this is normal.  Use the Promethazine DM cough syrup at bedtime for cough and congestion.  It will make you drowsy so do not take it during the day.  Return for reevaluation or see your primary care provider for any new or worsening symptoms.      ED Prescriptions     Medication Sig Dispense Auth. Provider   doxycycline (VIBRAMYCIN) 100 MG capsule Take 1 capsule (100 mg total) by mouth 2 (two) times daily. 20 capsule Becky Augusta, NP   benzonatate (TESSALON) 100 MG capsule Take 2 capsules (200 mg total) by mouth every 8 (eight) hours. 21 capsule Becky Augusta, NP   ipratropium (ATROVENT) 0.06 % nasal spray Place 2 sprays into both nostrils 4 (four) times daily. 15 mL Becky Augusta, NP   promethazine-dextromethorphan (PROMETHAZINE-DM) 6.25-15 MG/5ML  syrup Take 5 mLs by mouth 4 (four) times daily as needed. 118 mL Becky Augusta, NP      PDMP not reviewed this encounter.   Becky Augusta, NP 01/15/23 [phone]

## 2023-01-15 NOTE — Discharge Instructions (Addendum)
The doxycycline twice daily with food for 10 days for treatment of your URI.  Perform sinus irrigation 2-3 times a day with a [employer]Med sinus rinse kit and distilled water.  Do not use tap water.  You can use plain over-the-counter Mucinex every 6 hours to break up the stickiness of the mucus so your body can clear it.  Increase your oral fluid intake to thin out your mucus so that is also able for your body to clear more easily.  Take an over-the-counter probiotic, such as Culturelle-align-activia, 1 hour after each dose of antibiotic to prevent diarrhea.  Use the Atrovent nasal spray, 2 squirts in each nostril every 6 hours, as needed for runny nose and postnasal drip.  Use the Tessalon Perles every 8 hours during the day.  Take them with a small sip of water.  They may give you some numbness to the base of your tongue or a metallic taste in your mouth, this is normal.  Use the Promethazine DM cough syrup at bedtime for cough and congestion.  It will make you drowsy so do not take it during the day.  Return for reevaluation or see your primary care provider for any new or worsening symptoms.

## 2023-01-19 ENCOUNTER — Ambulatory Visit: Payer: PRIVATE HEALTH INSURANCE

## 2023-04-09 ENCOUNTER — Emergency Department
Admit: 2023-04-09 | Discharge: 2023-04-09 | Disposition: A | Discharge status: Home / Self Care | Payer: PRIVATE HEALTH INSURANCE

## 2023-04-09 DIAGNOSIS — R109 Unspecified abdominal pain: Principal | ICD-10-CM

## 2023-04-09 MED ORDER — DICYCLOMINE 20 MG TABLET
ORAL_TABLET | Freq: Two times a day (BID) | ORAL | 0 refills | 10.00 days | Status: CP
Start: 2023-04-09 — End: 2023-04-19

## 2023-10-01 ENCOUNTER — Ambulatory Visit: Payer: Self-pay | Admitting: Emergency Medicine

## 2023-10-01 ENCOUNTER — Ambulatory Visit
Admission: RE | Admit: 2023-10-01 | Discharge: 2023-10-01 | Payer: Self-pay | Source: Ambulatory Visit | Attending: Emergency Medicine | Admitting: Emergency Medicine

## 2023-10-01 VITALS — BP 148/104 | HR 85 | Temp 98.9°F | Resp 16 | Ht 66.0 in | Wt 290.0 lb

## 2023-10-01 DIAGNOSIS — N939 Abnormal uterine and vaginal bleeding, unspecified: Secondary | ICD-10-CM

## 2023-10-01 DIAGNOSIS — R42 Dizziness and giddiness: Secondary | ICD-10-CM

## 2023-10-01 DIAGNOSIS — R519 Headache, unspecified: Secondary | ICD-10-CM

## 2023-10-01 LAB — CBC WITH DIFFERENTIAL/PLATELET
Abs Immature Granulocytes: 0.06 K/uL (ref 0.00–0.07)
Basophils Absolute: 0 K/uL (ref 0.0–0.1)
Basophils Relative: 0 %
Eosinophils Absolute: 0.1 K/uL (ref 0.0–0.5)
Eosinophils Relative: 1 %
HCT: 40.3 % (ref 36.0–46.0)
Hemoglobin: 13.4 g/dL (ref 12.0–15.0)
Immature Granulocytes: 1 %
Lymphocytes Relative: 30 %
Lymphs Abs: 2.9 K/uL (ref 0.7–4.0)
MCH: 27.2 pg (ref 26.0–34.0)
MCHC: 33.3 g/dL (ref 30.0–36.0)
MCV: 81.9 fL (ref 80.0–100.0)
Monocytes Absolute: 0.6 K/uL (ref 0.1–1.0)
Monocytes Relative: 6 %
Neutro Abs: 6 K/uL (ref 1.7–7.7)
Neutrophils Relative %: 62 %
Platelets: 317 K/uL (ref 150–400)
RBC: 4.92 MIL/uL (ref 3.87–5.11)
RDW: 13.4 % (ref 11.5–15.5)
WBC: 9.7 K/uL (ref 4.0–10.5)
nRBC: 0 % (ref 0.0–0.2)

## 2023-10-01 LAB — PREGNANCY, URINE: Preg Test, Ur: NEGATIVE

## 2023-10-01 NOTE — ED Notes (Signed)
 Patient is being discharged from the Urgent Care and sent to the Emergency Department via POV . Per Dr.Mortenson, patient is in need of higher level of care due to Vaginal Bleeding. Patient is aware and verbalizes understanding of plan of care.  Vitals:   10/01/23 1116  BP: (!) 148/104  Pulse: 85  Resp: 16  Temp: 98.9 F (37.2 C)  SpO2: 98%

## 2023-10-01 NOTE — ED Triage Notes (Signed)
 Pt c/o vaginal bleeding. States I have PCOS, and have been bleeding with heavy clots for a month now. This has happened once before and they had to prescribe me medication to stop the bleeding. - Entered by patient.   Also c/o dizziness & bilateral muffled hearing x1 day. States recently started lamotrigine every day.

## 2023-10-01 NOTE — Discharge Instructions (Signed)
 Urine pregnancy negative.  Hemoglobin 13.4, which is stable.  I suspect that you can be treated with Megace and follow-up with your OB/GYN  I am primarily concerned about your constant vertigo for the past 3 days.  It could be Mnire's disease or vestibular neuritis, but I am concerned about a central cause such as intermittent TIAs, vertebral artery insufficiency due to your symptoms of intermittent left arm weakness, slurred speech, aphasia.  Go immediately to the emergency department.  Let them know if your symptoms return or get worse.

## 2023-10-01 NOTE — ED Provider Notes (Signed)
 HPI  SUBJECTIVE:  Abigail Hale is a 28 y.o. female who presents with 2 issues:  First, she reports 1 month of daily vaginal bleeding, going through 1-2 pads per day.  She has been passing clots since July 30.  She reports crampy, low midline pelvic pain that lasts minutes that has not changed recently.  This pain occasionally radiates around to her back.  No other abdominal pain.  She reports some vaginal odor, but no discharge, rash, or itching.  She reports shortness of breath, lightheadedness and states she feels as if she is about to pass out, but denies syncope.  No chest pain.  She has been taking supplemental iron, B12, magnesium, ibuprofen  600 to 800 mg twice daily.  The ibuprofen  helps with the cramping.  No aggravating factors.  She is in a long-term monogamous relationship with a female who is asymptomatic.  STDs are not a concern today.  She has had similar symptoms before, was placed on Megace with symptom resolution.  Second, she reports bilateral ear fullness worse on the left for the past 3 days.  No pain.  She reports decreased hearing, tinnitus, constant vertigo and fatigue.  She reports intermittent headaches, intermittent blurry vision, slurred speech/aphasia, intermittent left arm weakness, discoordination and bilateral leg weakness.  No nasal congestion, rhinorrhea, double vision or visual loss.  She was recently started on Lamictal, and was told that vertigo can be a side effect of the Lamictal.  There has been no other change in her medications.  She has not tried anything for symptoms.  Symptoms are better with lying still, worse with looking down, turning head, standing and walking.  She has a past medical history of hypertension, PCOS, eustachian tube dysfunction.  No history of coagulopathy, thrombocytopenia, anticoagulant/antiplatelet use, fibroids, vertigo, CVA, aneurysm, Mnire's disease.  Family history negative for CVA, aneurysm.  Past Medical History:  Diagnosis Date    Abnormal CT scan, gastrointestinal tract    Depression    Fatty liver    Hypertension    Insomnia    PCOS (polycystic ovarian syndrome)     Past Surgical History:  Procedure Laterality Date   COLONOSCOPY WITH PROPOFOL  N/A 09/22/2020   Procedure: COLONOSCOPY WITH PROPOFOL ;  Surgeon: Unk Corinn Skiff, MD;  Location: [facility];  Service: Endoscopy;  Laterality: N/A;   ESOPHAGOGASTRODUODENOSCOPY (EGD) WITH PROPOFOL  N/A 09/22/2020   Procedure: ESOPHAGOGASTRODUODENOSCOPY (EGD) WITH PROPOFOL ;  Surgeon: Unk Corinn Skiff, MD;  Location: [facility];  Service: Endoscopy;  Laterality: N/A;   TONSILLECTOMY      Family History  Problem Relation Age of Onset   Healthy Mother    Diabetes Father     Social History   Tobacco Use   Smoking status: Never   Smokeless tobacco: Never  Vaping Use   Vaping status: Never Used  Substance Use Topics   Alcohol use: Not Currently    Comment: socially   Drug use: Never    No current facility-administered medications for this encounter.  Current Outpatient Medications:    lamoTRIgine (LAMICTAL) 25 MG tablet, SMARTSIG:By Mouth, Disp: , Rfl:    albuterol  (VENTOLIN  HFA) 108 (90 Base) MCG/ACT inhaler, Inhale 1-2 puffs into the lungs every 6 (six) hours as needed for wheezing or shortness of breath., Disp: 1 g, Rfl: 0   benzonatate  (TESSALON ) 100 MG capsule, Take 2 capsules (200 mg total) by mouth every 8 (eight) hours., Disp: 21 capsule, Rfl: 0   clonazePAM (KLONOPIN) 0.5 MG tablet, Take 0.5 mg by  mouth daily as needed., Disp: , Rfl:    doxycycline  (VIBRAMYCIN ) 100 MG capsule, Take 1 capsule (100 mg total) by mouth 2 (two) times daily., Disp: 20 capsule, Rfl: 0   FLUoxetine (PROZAC) 10 MG capsule, Take 10-20 mg by mouth daily., Disp: , Rfl:    fluticasone  (FLONASE ) 50 MCG/ACT nasal spray, Place 2 sprays into both nostrils daily., Disp: 18.2 g, Rfl: 0   ipratropium (ATROVENT ) 0.06 % nasal spray, Place 2 sprays into both nostrils 4  (four) times daily., Disp: 15 mL, Rfl: 12   LORazepam (ATIVAN) 0.5 MG tablet, Take 0.5 mg by mouth daily as needed., Disp: , Rfl:    metFORMIN (GLUCOPHAGE) 500 MG tablet, Take by mouth daily with breakfast. For PCOS, Disp: , Rfl:    omeprazole  (PRILOSEC) 20 MG capsule, Take 1 capsule (20 mg total) by mouth daily before breakfast., Disp: 30 capsule, Rfl: 2   promethazine -dextromethorphan (PROMETHAZINE -DM) 6.25-15 MG/5ML syrup, Take 5 mLs by mouth 4 (four) times daily as needed., Disp: 118 mL, Rfl: 0  Allergies  Allergen Reactions   Adalimumab Hives   Amoxicillin Hives   Penicillins Hives    Has patient had a PCN reaction causing immediate rash, facial/tongue/throat swelling, SOB or lightheadedness with hypotension: No Has patient had a PCN reaction causing severe rash involving mucus membranes or skin necrosis: No Has patient had a PCN reaction that required hospitalization: No Has patient had a PCN reaction occurring within the last 10 years: No If all of the above answers are NO, then may proceed with Cephalosporin use.    Levofloxacin  Itching     ROS  As noted in HPI.   Physical Exam  BP (!) 148/104 (BP Location: Left Wrist)   Pulse 85   Temp 98.9 F (37.2 C) (Oral)   Resp 16   Ht 5' 6 (1.676 m)   Wt 131.5 kg   SpO2 98%   BMI 46.81 kg/m   Constitutional: Well developed, well nourished, no acute distress Eyes:  EOMI, conjunctiva normal bilaterally.  PERR LA HENT: Normocephalic, atraumatic,mucus membranes moist.  TMs normal bilaterally. Neck: No carotid bruit Respiratory: Normal inspiratory effort Cardiovascular: Normal rate, regular rhythm, no murmurs rubs gallops GI: nondistended skin: No rash, skin intact Musculoskeletal: no deformities Neurologic: Alert & oriented x 3, speech fluent.  Cranial nerves III through XII intact as tested.  Finger-nose, heel-to-shin within normal limits.  Patient unable to perform tandem gait.  Romberg negative.  No pronator drift upper  or lower extremities.  Dix-Hallpike negative bilaterally. Psychiatric: Speech and behavior appropriate   ED Course   Medications - No data to display  Orders Placed This Encounter  Procedures   Pregnancy, urine    Standing Status:   Standing    Number of Occurrences:   1   CBC with Differential    Standing Status:   Standing    Number of Occurrences:   1   CBC with Differential/Platelet    Standing Status:   Standing    Number of Occurrences:   1    Results for orders placed or performed during the hospital encounter of 10/01/23 (from the past 24 hours)  Pregnancy, urine     Status: None   Collection Time: 10/01/23 11:33 AM  Result Value Ref Range   Preg Test, Ur NEGATIVE NEGATIVE  CBC with Differential     Status: None   Collection Time: 10/01/23 11:33 AM  Result Value Ref Range   WBC 9.7 4.0 - 10.5 K/uL  RBC 4.92 3.87 - 5.11 MIL/uL   Hemoglobin 13.4 12.0 - 15.0 g/dL   HCT 59.6 63.9 - 53.9 %   MCV 81.9 80.0 - 100.0 fL   MCH 27.2 26.0 - 34.0 pg   MCHC 33.3 30.0 - 36.0 g/dL   RDW 86.5 88.4 - 84.4 %   Platelets 317 150 - 400 K/uL   nRBC 0.0 0.0 - 0.2 %   Neutrophils Relative % 62 %   Neutro Abs 6.0 1.7 - 7.7 K/uL   Lymphocytes Relative 30 %   Lymphs Abs 2.9 0.7 - 4.0 K/uL   Monocytes Relative 6 %   Monocytes Absolute 0.6 0.1 - 1.0 K/uL   Eosinophils Relative 1 %   Eosinophils Absolute 0.1 0.0 - 0.5 K/uL   Basophils Relative 0 %   Basophils Absolute 0.0 0.0 - 0.1 K/uL   Immature Granulocytes 1 %   Abs Immature Granulocytes 0.06 0.00 - 0.07 K/uL   No results found.  ED Clinical Impression  1. Vertigo   2. Acute nonintractable headache, unspecified headache type   3. Abnormal vaginal bleeding      ED Assessment/Plan      1.  Vaginal bleeding for a month.  Urine pregnancy is negative.  She is not acutely anemic, so this does not explain her fatigue or lightheadedness.  Suspect that she can be put on Megace with adequate control and follow-up with her  OB/GYN.  2.  3 days of constant vertigo, tinnitus with headache, intermittently blurred vision, slurred speech, aphasia, left arm weakness.  I would not expect slurred speech, aphasia or limb weakness with BPPV, vestibular neuritis, or Mnire's disease.  I am concerned for a central process such as TIA or vertebral artery insufficiency.  However, she is completely neurologically intact other than difficulty with tandem gait here.  It has been 3 days since her last known normal.  If this is a CVA, unfortunately, she is out of the tPA window.  Transferring to the emergency department for more comprehensive evaluation to rule out central cause of her vertigo.  Offered to send via EMS, but I believe that she is stable to go by private vehicle.  She states that she will get a family member to drive her.  Discussed rationale for transfer to the emergency department with the patient.  She agrees to go.  No orders of the defined types were placed in this encounter.     *This clinic note was created using Dragon dictation software. Therefore, there may be occasional mistakes despite careful proofreading.  ?    Van Knee, MD 10/01/23 1323

## 2023-12-24 DIAGNOSIS — R0602 Shortness of breath: Principal | ICD-10-CM

## 2023-12-24 DIAGNOSIS — R000 Tachycardia, unspecified: Principal | ICD-10-CM

## 2024-01-21 ENCOUNTER — Encounter
Admit: 2024-01-21 | Discharge: 2024-01-21 | Payer: Medicaid (Managed Care) | Attending: Student in an Organized Health Care Education/Training Program | Primary: Student in an Organized Health Care Education/Training Program

## 2024-01-21 DIAGNOSIS — R55 Syncope and collapse: Principal | ICD-10-CM

## 2024-01-21 DIAGNOSIS — R000 Tachycardia, unspecified: Principal | ICD-10-CM

## 2024-01-21 DIAGNOSIS — R0609 Other forms of dyspnea: Principal | ICD-10-CM

## 2024-01-21 DIAGNOSIS — R002 Palpitations: Principal | ICD-10-CM

## 2024-01-21 DIAGNOSIS — E282 Polycystic ovarian syndrome: Principal | ICD-10-CM

## 2024-02-10 ENCOUNTER — Inpatient Hospital Stay: Admit: 2024-02-10 | Discharge: 2024-02-11 | Payer: Medicaid (Managed Care)

## 2024-03-09 DIAGNOSIS — R0609 Other forms of dyspnea: Principal | ICD-10-CM

## 2024-03-09 DIAGNOSIS — R000 Tachycardia, unspecified: Secondary | ICD-10-CM

## 2024-03-09 DIAGNOSIS — R002 Palpitations: Secondary | ICD-10-CM
